# Patient Record
Sex: Female | Born: 1940 | Race: White | Hispanic: No | Smoking: Former smoker
Health system: Southern US, Community
[De-identification: ages and names within clinical notes are randomized; demographics above are authoritative.]

## PROBLEM LIST (undated history)

## (undated) DIAGNOSIS — M5030 Other cervical disc degeneration, unspecified cervical region: Secondary | ICD-10-CM

## (undated) DIAGNOSIS — G43909 Migraine, unspecified, not intractable, without status migrainosus: Secondary | ICD-10-CM

## (undated) DIAGNOSIS — E079 Disorder of thyroid, unspecified: Secondary | ICD-10-CM

## (undated) DIAGNOSIS — I4891 Unspecified atrial fibrillation: Secondary | ICD-10-CM

## (undated) DIAGNOSIS — F0280 Dementia in other diseases classified elsewhere without behavioral disturbance: Secondary | ICD-10-CM

## (undated) DIAGNOSIS — E7800 Pure hypercholesterolemia, unspecified: Secondary | ICD-10-CM

## (undated) DIAGNOSIS — I10 Essential (primary) hypertension: Secondary | ICD-10-CM

## (undated) HISTORY — PX: APPENDECTOMY: SHX54

## (undated) HISTORY — PX: ABDOMINAL HYSTERECTOMY: SHX81

## (undated) HISTORY — PX: THYROIDECTOMY: SHX17

## (undated) HISTORY — PX: TONSILLECTOMY: SUR1361

---

## 2019-02-25 ENCOUNTER — Emergency Department (HOSPITAL_COMMUNITY): Payer: Medicare Other

## 2019-02-25 ENCOUNTER — Encounter (HOSPITAL_COMMUNITY): Payer: Self-pay | Admitting: *Deleted

## 2019-02-25 ENCOUNTER — Emergency Department (HOSPITAL_COMMUNITY)
Admission: EM | Admit: 2019-02-25 | Discharge: 2019-02-25 | Disposition: A | Discharge status: Home / Self Care | Payer: Medicare Other | Attending: Emergency Medicine | Admitting: Emergency Medicine

## 2019-02-25 ENCOUNTER — Other Ambulatory Visit: Payer: Self-pay

## 2019-02-25 DIAGNOSIS — Y999 Unspecified external cause status: Secondary | ICD-10-CM | POA: Diagnosis not present

## 2019-02-25 DIAGNOSIS — I10 Essential (primary) hypertension: Secondary | ICD-10-CM | POA: Diagnosis not present

## 2019-02-25 DIAGNOSIS — G309 Alzheimer's disease, unspecified: Secondary | ICD-10-CM | POA: Diagnosis not present

## 2019-02-25 DIAGNOSIS — S40012A Contusion of left shoulder, initial encounter: Secondary | ICD-10-CM | POA: Diagnosis not present

## 2019-02-25 DIAGNOSIS — S8001XA Contusion of right knee, initial encounter: Secondary | ICD-10-CM | POA: Insufficient documentation

## 2019-02-25 DIAGNOSIS — W19XXXA Unspecified fall, initial encounter: Secondary | ICD-10-CM | POA: Diagnosis not present

## 2019-02-25 DIAGNOSIS — Y92129 Unspecified place in nursing home as the place of occurrence of the external cause: Secondary | ICD-10-CM | POA: Insufficient documentation

## 2019-02-25 DIAGNOSIS — S8002XA Contusion of left knee, initial encounter: Secondary | ICD-10-CM | POA: Diagnosis not present

## 2019-02-25 DIAGNOSIS — Y939 Activity, unspecified: Secondary | ICD-10-CM | POA: Insufficient documentation

## 2019-02-25 DIAGNOSIS — I4891 Unspecified atrial fibrillation: Secondary | ICD-10-CM | POA: Insufficient documentation

## 2019-02-25 DIAGNOSIS — S0083XA Contusion of other part of head, initial encounter: Secondary | ICD-10-CM | POA: Diagnosis not present

## 2019-02-25 DIAGNOSIS — S8000XA Contusion of unspecified knee, initial encounter: Secondary | ICD-10-CM

## 2019-02-25 DIAGNOSIS — S40011A Contusion of right shoulder, initial encounter: Secondary | ICD-10-CM | POA: Diagnosis not present

## 2019-02-25 DIAGNOSIS — E039 Hypothyroidism, unspecified: Secondary | ICD-10-CM | POA: Diagnosis not present

## 2019-02-25 DIAGNOSIS — S40019A Contusion of unspecified shoulder, initial encounter: Secondary | ICD-10-CM

## 2019-02-25 DIAGNOSIS — S60222A Contusion of left hand, initial encounter: Secondary | ICD-10-CM | POA: Diagnosis not present

## 2019-02-25 DIAGNOSIS — S0990XA Unspecified injury of head, initial encounter: Secondary | ICD-10-CM | POA: Diagnosis present

## 2019-02-25 HISTORY — DX: Other cervical disc degeneration, unspecified cervical region: M50.30

## 2019-02-25 HISTORY — DX: Dementia in other diseases classified elsewhere, unspecified severity, without behavioral disturbance, psychotic disturbance, mood disturbance, and anxiety: F02.80

## 2019-02-25 HISTORY — DX: Disorder of thyroid, unspecified: E07.9

## 2019-02-25 HISTORY — DX: Pure hypercholesterolemia, unspecified: E78.00

## 2019-02-25 HISTORY — DX: Unspecified atrial fibrillation: I48.91

## 2019-02-25 HISTORY — DX: Essential (primary) hypertension: I10

## 2019-02-25 HISTORY — DX: Migraine, unspecified, not intractable, without status migrainosus: G43.909

## 2019-02-25 LAB — BASIC METABOLIC PANEL
Anion gap: 13 (ref 5–15)
BUN: 29 mg/dL — ABNORMAL HIGH (ref 8–23)
CO2: 24 mmol/L (ref 22–32)
Calcium: 9.3 mg/dL (ref 8.9–10.3)
Chloride: 103 mmol/L (ref 98–111)
Creatinine, Ser: 0.84 mg/dL (ref 0.44–1.00)
GFR calc Af Amer: >60 mL/min (ref >60)
GFR calc non Af Amer: >60 mL/min (ref >60)
Glucose, Bld: 114 mg/dL — ABNORMAL HIGH (ref 70–99)
Potassium: 4.2 mmol/L (ref 3.5–5.1)
Sodium: 140 mmol/L (ref 135–145)

## 2019-02-25 LAB — CBC WITH DIFFERENTIAL/PLATELET
Abs Immature Granulocytes: 0.06 10*3/uL (ref 0.00–0.07)
Basophils Absolute: 0 10*3/uL (ref 0.0–0.1)
Basophils Relative: 0 %
Eosinophils Absolute: 0 10*3/uL (ref 0.0–0.5)
Eosinophils Relative: 0 %
HCT: 41.5 % (ref 36.0–46.0)
Hemoglobin: 13.4 g/dL (ref 12.0–15.0)
Immature Granulocytes: 1 %
Lymphocytes Relative: 7 %
Lymphs Abs: 0.8 10*3/uL (ref 0.7–4.0)
MCH: 30 pg (ref 26.0–34.0)
MCHC: 32.3 g/dL (ref 30.0–36.0)
MCV: 92.8 fL (ref 80.0–100.0)
Monocytes Absolute: 0.9 10*3/uL (ref 0.1–1.0)
Monocytes Relative: 8 %
Neutro Abs: 9.6 10*3/uL — ABNORMAL HIGH (ref 1.7–7.7)
Neutrophils Relative %: 84 %
Platelets: 255 10*3/uL (ref 150–400)
RBC: 4.47 MIL/uL (ref 3.87–5.11)
RDW: 14.2 % (ref 11.5–15.5)
WBC: 11.4 10*3/uL — ABNORMAL HIGH (ref 4.0–10.5)
nRBC: 0 % (ref 0.0–0.2)

## 2019-02-25 LAB — CK: Total CK: 163 U/L (ref 38–234)

## 2019-02-25 NOTE — ED Triage Notes (Addendum)
Pt arrives via [facility] from [facility]. Pt was last seen around 2200 last night. Staff found her in a different building on the floor around 0430. Hx of dementia (at baseline). She does have dirt on her pants, ? Outside at some point during the night. Unknown how many times she has fallen since last being seen. Bruising to bilateral eyebrow, abrasions, and hand pain (bilateral). C collar in place. 110/60, hr 65, cbg 150. Pt is on ELIQUIS.

## 2019-02-25 NOTE — ED Notes (Signed)
Daughter called due to pts rings were in bed in a cup. Daughter states she will come back to get them, gave her my personal number so I can run them outside when she's here.

## 2019-02-25 NOTE — ED Notes (Signed)
Removed two white stone rings from the left hand, placed in cup, labeled and taped to pt bed

## 2019-02-25 NOTE — Progress Notes (Signed)
Orthopedic Tech Progress Note Patient Details:  Alison King 09/09/1940 [id number]  Ortho Devices Type of Ortho Device: Velcro wrist forearm splint Ortho Device/Splint Location: LUE Ortho Device/Splint Interventions: Adjustment, Application, Ordered   Post Interventions Patient Tolerated: Well Instructions Provided: Care of device, Adjustment of device   Janit Pagan 02/25/2019, 10:13 AM

## 2019-02-25 NOTE — Discharge Instructions (Addendum)
Wear splint as needed for comfort. May apply ice for 20 minutes at a time, elevate as tolerated to help with pain and swelling. Tylenol as needed as directed.

## 2019-02-25 NOTE — ED Notes (Signed)
Patient's daughter in law at bedside verbalized understanding of dc instructions. pts vss, assisted into car by this RN and Joellen Jersey, Therapist, sports.

## 2019-02-25 NOTE — ED Provider Notes (Signed)
[facility] Provider Note   CSN: [id number] Arrival date & time: 02/25/19  0532     History   Chief Complaint Chief Complaint  Patient presents with   Fall    HPI Crista CurbGlenna Mcquain is a 78 y.o. female.     78 year old female with history of dementia, a-fib, on Eliquis brought in by EMS from nursing facility.  Patient was last seen at 10:00 last night, was found at 430 this morning inside a different building.  Patient is unsure what happened to her, is able to state that she fell and her head hurts, shoulders hurt, left hand hurts..  Patient has a history of dementia, is reported to be at baseline.     Past Medical History:  Diagnosis Date   Alzheimer's dementia [facility](HCC)    Atrial fibrillation (HCC)    DDD (degenerative disc disease), cervical    High cholesterol    Hypertension    Migraine    Thyroid disease    hypothyroidism    There are no active problems to display for this patient.   Past Surgical History:  Procedure Laterality Date   ABDOMINAL HYSTERECTOMY     APPENDECTOMY     THYROIDECTOMY     TONSILLECTOMY       OB History   No obstetric history on file.      Home Medications    Prior to Admission medications   Not on File    Family History No family history on file.  Social History Social History   Tobacco Use   Smoking status: Not on file  Substance Use Topics   Alcohol use: Not on file   Drug use: Not on file     Allergies   Atorvastatin calcium and Sulfa antibiotics   Review of Systems Review of Systems  Unable to perform ROS: Dementia  Musculoskeletal: Positive for arthralgias.  Neurological: Positive for headaches.     Physical Exam Updated Vital Signs BP 132/72 (BP Location: Right Arm)    Pulse 68    Temp 97.7 F (36.5 C) (Oral)    Resp 19    SpO2 95%   Physical Exam Vitals signs and nursing note reviewed.  Constitutional:      General: She is not in acute  distress.    Appearance: She is well-developed. She is not diaphoretic.  HENT:     Head: Normocephalic.      Mouth/Throat:     Mouth: Mucous membranes are moist.  Eyes:     Extraocular Movements: Extraocular movements intact.     Pupils: Pupils are equal, round, and reactive to light.  Neck:     Musculoskeletal: No muscular tenderness.  Pulmonary:     Effort: Pulmonary effort is normal.  Abdominal:     Palpations: Abdomen is soft.     Tenderness: There is no abdominal tenderness.  Musculoskeletal:        General: Swelling, tenderness and signs of injury present. No deformity.     Right shoulder: She exhibits tenderness.     Left shoulder: She exhibits tenderness.     Right knee: Tenderness found.     Left knee: Tenderness found.     Cervical back: She exhibits no bony tenderness.     Thoracic back: She exhibits no bony tenderness.     Lumbar back: She exhibits no tenderness.     Left hand: She exhibits tenderness and swelling.     Right lower leg: No edema.  Left lower leg: No edema.     Comments: Bruising to bilateral anterior knees, proximal humerus, left hand. No pain with rotation of the hips.  Skin:    General: Skin is warm and dry.     Findings: Bruising present. No erythema or rash.  Neurological:     Mental Status: She is alert and oriented to person, place, and time.  Psychiatric:        Behavior: Behavior normal.      ED Treatments / Results  Labs (all labs ordered are listed, but only abnormal results are displayed) Labs Reviewed  BASIC METABOLIC PANEL - Abnormal; Notable for the following components:      Result Value   Glucose, Bld 114 (*)    BUN 29 (*)    All other components within normal limits  CBC WITH DIFFERENTIAL/PLATELET - Abnormal; Notable for the following components:   WBC 11.4 (*)    Neutro Abs 9.6 (*)    All other components within normal limits  CK    EKG EKG Interpretation  Date/Time:  Wednesday February 25 2019 05:40:45  EST Ventricular Rate:  68 PR Interval:    QRS Duration: 131 QT Interval:  465 QTC Calculation: 495 R Axis:   11 Text Interpretation: Sinus or ectopic atrial rhythm Left bundle branch block No previous ECGs available Abnormal ekg Confirmed by Zadie Rhine ([id number]) on 02/25/2019 5:44:27 AM   Radiology Dg Chest 1 View  Result Date: 02/25/2019 CLINICAL DATA:  Fall at home. EXAM: CHEST  1 VIEW COMPARISON:  None. FINDINGS: The heart size and mediastinal contours are within normal limits. Both lungs are clear. No pneumothorax pleural effusion is noted. The visualized skeletal structures are unremarkable. IMPRESSION: No active disease. Electronically Signed   By: Lupita Raider M.D.   On: 02/25/2019 08:53   Dg Shoulder Right  Result Date: 02/25/2019 CLINICAL DATA:  Right shoulder pain after fall. EXAM: RIGHT SHOULDER - 2+ VIEW COMPARISON:  None. FINDINGS: There is no evidence of fracture or dislocation. There is no evidence of arthropathy or other focal bone abnormality. Soft tissues are unremarkable. IMPRESSION: Negative. Electronically Signed   By: Lupita Raider M.D.   On: 02/25/2019 08:47   Ct Head Wo Contrast  Result Date: 02/25/2019 CLINICAL DATA:  Posttraumatic headache after fall. EXAM: CT HEAD WITHOUT CONTRAST CT CERVICAL SPINE WITHOUT CONTRAST TECHNIQUE: Multidetector CT imaging of the head and cervical spine was performed following the standard protocol without intravenous contrast. Multiplanar CT image reconstructions of the cervical spine were also generated. COMPARISON:  None. FINDINGS: CT HEAD FINDINGS Brain: Mild chronic ischemic white matter disease is noted. No mass effect or midline shift is noted. Ventricular size is within normal limits. There is no evidence of mass lesion, hemorrhage or acute infarction. Vascular: No hyperdense vessel or unexpected calcification. Skull: Normal. Negative for fracture or focal lesion. Sinuses/Orbits: No acute finding. Other: Small left  frontal scalp hematoma is noted. CT CERVICAL SPINE FINDINGS Alignment: Minimal grade 1 retrolisthesis of C4-5 and C5-6 is noted secondary to degenerative disc disease at these levels. Skull base and vertebrae: No acute fracture. No primary bone lesion or focal pathologic process. Soft tissues and spinal canal: No prevertebral fluid or swelling. No visible canal hematoma. Disc levels: Moderate degenerative disc disease is noted at C4-5, C5-6 and C6-7. Upper chest: Negative. Other: None. IMPRESSION: Small left frontal scalp hematoma. Mild chronic ischemic white matter disease. No acute intracranial abnormality seen. Moderate multilevel degenerative disc disease. No acute abnormality  seen in the cervical spine. Electronically Signed   By: Marijo Conception M.D.   On: 02/25/2019 07:54   Ct Cervical Spine Wo Contrast  Result Date: 02/25/2019 CLINICAL DATA:  Posttraumatic headache after fall. EXAM: CT HEAD WITHOUT CONTRAST CT CERVICAL SPINE WITHOUT CONTRAST TECHNIQUE: Multidetector CT imaging of the head and cervical spine was performed following the standard protocol without intravenous contrast. Multiplanar CT image reconstructions of the cervical spine were also generated. COMPARISON:  None. FINDINGS: CT HEAD FINDINGS Brain: Mild chronic ischemic white matter disease is noted. No mass effect or midline shift is noted. Ventricular size is within normal limits. There is no evidence of mass lesion, hemorrhage or acute infarction. Vascular: No hyperdense vessel or unexpected calcification. Skull: Normal. Negative for fracture or focal lesion. Sinuses/Orbits: No acute finding. Other: Small left frontal scalp hematoma is noted. CT CERVICAL SPINE FINDINGS Alignment: Minimal grade 1 retrolisthesis of C4-5 and C5-6 is noted secondary to degenerative disc disease at these levels. Skull base and vertebrae: No acute fracture. No primary bone lesion or focal pathologic process. Soft tissues and spinal canal: No prevertebral  fluid or swelling. No visible canal hematoma. Disc levels: Moderate degenerative disc disease is noted at C4-5, C5-6 and C6-7. Upper chest: Negative. Other: None. IMPRESSION: Small left frontal scalp hematoma. Mild chronic ischemic white matter disease. No acute intracranial abnormality seen. Moderate multilevel degenerative disc disease. No acute abnormality seen in the cervical spine. Electronically Signed   By: Marijo Conception M.D.   On: 02/25/2019 07:54   Dg Shoulder Left  Result Date: 02/25/2019 CLINICAL DATA:  78 year old female with fall and left shoulder pain. EXAM: LEFT SHOULDER - 2+ VIEW COMPARISON:  None. FINDINGS: There is no evidence of fracture or dislocation. There is no evidence of arthropathy or other focal bone abnormality. Soft tissues are unremarkable. IMPRESSION: Negative. Electronically Signed   By: Anner Crete M.D.   On: 02/25/2019 08:47   Dg Knee Complete 4 Views Left  Result Date: 02/25/2019 CLINICAL DATA:  Left knee pain after fall at home. EXAM: LEFT KNEE - COMPLETE 4+ VIEW COMPARISON:  None. FINDINGS: No evidence of fracture, dislocation, or joint effusion. No evidence of arthropathy or other focal bone abnormality. Soft tissues are unremarkable. IMPRESSION: Negative. Electronically Signed   By: Marijo Conception M.D.   On: 02/25/2019 08:50   Dg Knee Complete 4 Views Right  Result Date: 02/25/2019 CLINICAL DATA:  Right knee pain after fall. EXAM: RIGHT KNEE - COMPLETE 4+ VIEW COMPARISON:  None. FINDINGS: No evidence of fracture, dislocation, or joint effusion. No evidence of arthropathy or other focal bone abnormality. Soft tissues are unremarkable. IMPRESSION: Negative. Electronically Signed   By: Marijo Conception M.D.   On: 02/25/2019 08:49   Dg Hand Complete Left  Result Date: 02/25/2019 CLINICAL DATA:  Left hand pain after fall. EXAM: LEFT HAND - COMPLETE 3+ VIEW COMPARISON:  None. FINDINGS: There is no evidence of fracture or dislocation. There is no evidence of  arthropathy or other focal bone abnormality. Soft tissues are unremarkable. IMPRESSION: Negative. Electronically Signed   By: Marijo Conception M.D.   On: 02/25/2019 08:51    Procedures Procedures (including critical care time)  Medications Ordered in ED Medications - No data to display   Initial Impression / Assessment and Plan / ED Course  I have reviewed the triage vital signs and the nursing notes.  Pertinent labs & imaging results that were available during my care of the patient were  reviewed by me and considered in my medical decision making (see chart for details).  Clinical Course as of Feb 25 1455  Wed Feb 25, 2019  1061 78 year old female brought in by EMS from nursing facility after fall today.  Patient found to have bruising to left and right forehead areas as well as bilateral shoulders, bilateral knees, left hand.  CT head and C-spine without significant findings, x-rays of both shoulders, knees, left hand unremarkable, negative for acute bony injury.  CBC without significant findings, awaiting CK, BMP and urinalysis.  Patient's daughter-in-law is at bedside, discussed results and updated on plan of care.   [LM]  0931 CK WNL, offered to check UA, patient's daughter in law declines, no history of frequent UTIs, has follow up later in the week, will check then if needed.   [LM]    Clinical Course User Index [LM] Jeannie Fend, PA-C      Final Clinical Impressions(s) / ED Diagnoses   Final diagnoses:  Fall, initial encounter  Contusion of face, initial encounter  Contusion of shoulder, unspecified laterality, initial encounter  Contusion of knee, unspecified laterality, initial encounter  Contusion of left hand, initial encounter    ED Discharge Orders    None       Jeannie Fend, PA-C 02/25/19 1456    Long, Arlyss Repress, MD 02/26/19 [phone]

## 2019-02-25 NOTE — ED Notes (Signed)
Pt is currently in radiology needs blood draw when back.

## 2019-06-12 ENCOUNTER — Emergency Department (HOSPITAL_COMMUNITY): Payer: Medicare Other

## 2019-06-12 ENCOUNTER — Inpatient Hospital Stay (HOSPITAL_COMMUNITY)
Admission: EM | Admit: 2019-06-12 | Discharge: 2019-06-17 | DRG: 689 | Disposition: A | Discharge status: Skilled Nursing Facility | Payer: Medicare Other | Source: Skilled Nursing Facility | Attending: Internal Medicine | Admitting: Internal Medicine

## 2019-06-12 ENCOUNTER — Encounter (HOSPITAL_COMMUNITY): Payer: Self-pay | Admitting: *Deleted

## 2019-06-12 ENCOUNTER — Other Ambulatory Visit: Payer: Self-pay

## 2019-06-12 DIAGNOSIS — Z1623 Resistance to quinolones and fluoroquinolones: Secondary | ICD-10-CM | POA: Diagnosis present

## 2019-06-12 DIAGNOSIS — G9341 Metabolic encephalopathy: Secondary | ICD-10-CM | POA: Diagnosis present

## 2019-06-12 DIAGNOSIS — E785 Hyperlipidemia, unspecified: Secondary | ICD-10-CM | POA: Diagnosis present

## 2019-06-12 DIAGNOSIS — Z79899 Other long term (current) drug therapy: Secondary | ICD-10-CM

## 2019-06-12 DIAGNOSIS — E890 Postprocedural hypothyroidism: Secondary | ICD-10-CM | POA: Diagnosis present

## 2019-06-12 DIAGNOSIS — N3000 Acute cystitis without hematuria: Principal | ICD-10-CM

## 2019-06-12 DIAGNOSIS — Z90710 Acquired absence of both cervix and uterus: Secondary | ICD-10-CM

## 2019-06-12 DIAGNOSIS — I480 Paroxysmal atrial fibrillation: Secondary | ICD-10-CM | POA: Diagnosis present

## 2019-06-12 DIAGNOSIS — R4182 Altered mental status, unspecified: Secondary | ICD-10-CM

## 2019-06-12 DIAGNOSIS — Z66 Do not resuscitate: Secondary | ICD-10-CM | POA: Diagnosis present

## 2019-06-12 DIAGNOSIS — B9620 Unspecified Escherichia coli [E. coli] as the cause of diseases classified elsewhere: Secondary | ICD-10-CM | POA: Diagnosis present

## 2019-06-12 DIAGNOSIS — Z79890 Hormone replacement therapy (postmenopausal): Secondary | ICD-10-CM

## 2019-06-12 DIAGNOSIS — Z20822 Contact with and (suspected) exposure to covid-19: Secondary | ICD-10-CM | POA: Diagnosis present

## 2019-06-12 DIAGNOSIS — R638 Other symptoms and signs concerning food and fluid intake: Secondary | ICD-10-CM

## 2019-06-12 DIAGNOSIS — F0280 Dementia in other diseases classified elsewhere without behavioral disturbance: Secondary | ICD-10-CM | POA: Diagnosis present

## 2019-06-12 DIAGNOSIS — N390 Urinary tract infection, site not specified: Secondary | ICD-10-CM

## 2019-06-12 DIAGNOSIS — Z87440 Personal history of urinary (tract) infections: Secondary | ICD-10-CM

## 2019-06-12 DIAGNOSIS — G309 Alzheimer's disease, unspecified: Secondary | ICD-10-CM | POA: Diagnosis present

## 2019-06-12 DIAGNOSIS — G8929 Other chronic pain: Secondary | ICD-10-CM | POA: Diagnosis present

## 2019-06-12 DIAGNOSIS — F05 Delirium due to known physiological condition: Secondary | ICD-10-CM | POA: Diagnosis not present

## 2019-06-12 DIAGNOSIS — R5383 Other fatigue: Secondary | ICD-10-CM

## 2019-06-12 DIAGNOSIS — Z888 Allergy status to other drugs, medicaments and biological substances status: Secondary | ICD-10-CM

## 2019-06-12 DIAGNOSIS — I10 Essential (primary) hypertension: Secondary | ICD-10-CM | POA: Diagnosis present

## 2019-06-12 DIAGNOSIS — K219 Gastro-esophageal reflux disease without esophagitis: Secondary | ICD-10-CM | POA: Diagnosis present

## 2019-06-12 DIAGNOSIS — W19XXXA Unspecified fall, initial encounter: Secondary | ICD-10-CM

## 2019-06-12 DIAGNOSIS — Z79891 Long term (current) use of opiate analgesic: Secondary | ICD-10-CM

## 2019-06-12 DIAGNOSIS — Z781 Physical restraint status: Secondary | ICD-10-CM

## 2019-06-12 DIAGNOSIS — R296 Repeated falls: Secondary | ICD-10-CM | POA: Diagnosis present

## 2019-06-12 DIAGNOSIS — Z882 Allergy status to sulfonamides status: Secondary | ICD-10-CM

## 2019-06-12 LAB — CBC WITH DIFFERENTIAL/PLATELET
Abs Immature Granulocytes: 0.03 10*3/uL (ref 0.00–0.07)
Basophils Absolute: 0 10*3/uL (ref 0.0–0.1)
Basophils Relative: 1 %
Eosinophils Absolute: 0.1 10*3/uL (ref 0.0–0.5)
Eosinophils Relative: 3 %
HCT: 38.7 % (ref 36.0–46.0)
Hemoglobin: 12.5 g/dL (ref 12.0–15.0)
Immature Granulocytes: 1 %
Lymphocytes Relative: 26 %
Lymphs Abs: 1.4 10*3/uL (ref 0.7–4.0)
MCH: 30.6 pg (ref 26.0–34.0)
MCHC: 32.3 g/dL (ref 30.0–36.0)
MCV: 94.9 fL (ref 80.0–100.0)
Monocytes Absolute: 0.8 10*3/uL (ref 0.1–1.0)
Monocytes Relative: 15 %
Neutro Abs: 2.9 10*3/uL (ref 1.7–7.7)
Neutrophils Relative %: 54 %
Platelets: 201 10*3/uL (ref 150–400)
RBC: 4.08 MIL/uL (ref 3.87–5.11)
RDW: 13.7 % (ref 11.5–15.5)
WBC: 5.2 10*3/uL (ref 4.0–10.5)
nRBC: 0 % (ref 0.0–0.2)

## 2019-06-12 LAB — URINALYSIS, ROUTINE W REFLEX MICROSCOPIC
Bilirubin Urine: NEGATIVE
Glucose, UA: NEGATIVE mg/dL
Hgb urine dipstick: NEGATIVE
Ketones, ur: NEGATIVE mg/dL
Nitrite: POSITIVE — AB
Protein, ur: NEGATIVE mg/dL
Specific Gravity, Urine: 1.009 (ref 1.005–1.030)
pH: 6 (ref 5.0–8.0)

## 2019-06-12 LAB — COMPREHENSIVE METABOLIC PANEL
ALT: 24 U/L (ref 0–44)
AST: 20 U/L (ref 15–41)
Albumin: 4 g/dL (ref 3.5–5.0)
Alkaline Phosphatase: 86 U/L (ref 38–126)
Anion gap: 8 (ref 5–15)
BUN: 20 mg/dL (ref 8–23)
CO2: 27 mmol/L (ref 22–32)
Calcium: 9.3 mg/dL (ref 8.9–10.3)
Chloride: 103 mmol/L (ref 98–111)
Creatinine, Ser: 0.73 mg/dL (ref 0.44–1.00)
GFR calc Af Amer: >60 mL/min (ref >60)
GFR calc non Af Amer: >60 mL/min (ref >60)
Glucose, Bld: 100 mg/dL — ABNORMAL HIGH (ref 70–99)
Potassium: 4.3 mmol/L (ref 3.5–5.1)
Sodium: 138 mmol/L (ref 135–145)
Total Bilirubin: 0.7 mg/dL (ref 0.3–1.2)
Total Protein: 7.1 g/dL (ref 6.5–8.1)

## 2019-06-12 LAB — LACTIC ACID, PLASMA: Lactic Acid, Venous: 1 mmol/L (ref 0.5–1.9)

## 2019-06-12 LAB — TSH: TSH: 0.309 u[IU]/mL — ABNORMAL LOW (ref 0.350–4.500)

## 2019-06-12 MED ORDER — SODIUM CHLORIDE 0.9 % IV BOLUS
1000.0000 mL | Freq: Once | INTRAVENOUS | Status: AC
  Administered 2019-06-12: 1000 mL via INTRAVENOUS

## 2019-06-12 MED ORDER — SODIUM CHLORIDE 0.9 % IV SOLN
1.0000 g | Freq: Once | INTRAVENOUS | Status: AC
  Administered 2019-06-12: 1 g via INTRAVENOUS
  Filled 2019-06-12: qty 10

## 2019-06-12 NOTE — ED Notes (Signed)
Pt to xray and ct

## 2019-06-12 NOTE — ED Triage Notes (Addendum)
Pt's husband called EMS because pt has been mildly more confused then baseline dementia and had an increase in general weakness and lethargy. Pt received her 2nd dose of the covid vaccine yesterday. Also EMS noted that the pt's urine was cloudy and odorous when they assisted the pt to the bathroom prior to transfer to the ER. ALso of note pt had recent changes made to her psych medications

## 2019-06-12 NOTE — ED Notes (Signed)
POA at bedside and MD made aware.

## 2019-06-12 NOTE — ED Provider Notes (Signed)
[facility]-EMERGENCY DEPT Provider Note   CSN: [id number] Arrival date & time: 06/12/19  2000     History Chief Complaint  Patient presents with  . Weakness    Alison King is a 79 y.o. female.  The history is provided by the patient and medical records. No language interpreter was used.  Altered Mental Status Presenting symptoms: confusion and unresponsiveness   Severity:  Moderate Most recent episode:  Today Episode history:  Single Timing:  Constant Progression:  Waxing and waning Chronicity:  New Associated symptoms: decreased appetite and hallucinations (at baseline)   Associated symptoms: no abdominal pain, no agitation, no depression, no fever, no headaches, no light-headedness, no nausea, no palpitations, no rash, no vomiting and no weakness        Past Medical History:  Diagnosis Date  . Alzheimer's dementia (HCC)   . Atrial fibrillation (HCC)   . DDD (degenerative disc disease), cervical   . High cholesterol   . Hypertension   . Migraine   . Thyroid disease    hypothyroidism    There are no problems to display for this patient.   Past Surgical History:  Procedure Laterality Date  . ABDOMINAL HYSTERECTOMY    . APPENDECTOMY    . THYROIDECTOMY    . TONSILLECTOMY       OB History   No obstetric history on file.     History reviewed. No pertinent family history.  Social History   Tobacco Use  . Smoking status: Not on file  Substance Use Topics  . Alcohol use: Not on file  . Drug use: Not on file    Home Medications Prior to Admission medications   Not on File    Allergies    Atorvastatin calcium and Sulfa antibiotics  Review of Systems   Review of Systems  Constitutional: Positive for decreased appetite and fatigue. Negative for chills and fever.  HENT: Negative for congestion.   Eyes: Negative for visual disturbance.  Respiratory: Negative for cough, chest tightness, shortness of breath and wheezing.     Cardiovascular: Negative for chest pain and palpitations.  Gastrointestinal: Negative for abdominal pain, constipation, diarrhea, nausea and vomiting.  Genitourinary: Positive for decreased urine volume. Negative for dysuria.  Musculoskeletal: Negative for back pain, neck pain and neck stiffness.  Skin: Negative for rash and wound.  Neurological: Negative for dizziness, weakness, light-headedness, numbness and headaches.  Psychiatric/Behavioral: Positive for confusion and hallucinations (at baseline). Negative for agitation.  All other systems reviewed and are negative.   Physical Exam Updated Vital Signs BP 133/89 (BP Location: Right Arm)   Pulse 67   Temp 98.2 F (36.8 C) (Oral)   Resp 19   SpO2 99% Comment: Simultaneous filing. User may not have seen previous data.  Physical Exam Vitals and nursing note reviewed.  Constitutional:      General: She is not in acute distress.    Appearance: She is well-developed. She is not ill-appearing, toxic-appearing or diaphoretic.  HENT:     Head: Normocephalic and atraumatic.     Nose: No congestion or rhinorrhea.     Mouth/Throat:     Mouth: Mucous membranes are moist.     Pharynx: No oropharyngeal exudate or posterior oropharyngeal erythema.  Eyes:     Extraocular Movements: Extraocular movements intact.     Conjunctiva/sclera: Conjunctivae normal.     Pupils: Pupils are equal, round, and reactive to light.  Cardiovascular:     Rate and Rhythm: Normal rate and regular rhythm.  Pulses: Normal pulses.     Heart sounds: No murmur.  Pulmonary:     Effort: Pulmonary effort is normal. No respiratory distress.     Breath sounds: Normal breath sounds. No wheezing, rhonchi or rales.  Chest:     Chest wall: No tenderness.  Abdominal:     General: Abdomen is flat.     Palpations: Abdomen is soft.     Tenderness: There is no abdominal tenderness. There is no right CVA tenderness, left CVA tenderness, guarding or rebound.   Musculoskeletal:        General: No tenderness.     Cervical back: Neck supple. No tenderness.  Skin:    General: Skin is warm and dry.     Capillary Refill: Capillary refill takes less than 2 seconds.     Coloration: Skin is not pale.     Findings: No erythema or rash.  Neurological:     General: No focal deficit present.     Mental Status: She is alert. She is confused.     Cranial Nerves: No dysarthria.     Motor: Tremor present. No atrophy or abnormal muscle tone.     ED Results / Procedures / Treatments   Labs (all labs ordered are listed, but only abnormal results are displayed) Labs Reviewed  COMPREHENSIVE METABOLIC PANEL - Abnormal; Notable for the following components:      Result Value   Glucose, Bld 100 (*)    All other components within normal limits  TSH - Abnormal; Notable for the following components:   TSH 0.309 (*)    All other components within normal limits  URINALYSIS, ROUTINE W REFLEX MICROSCOPIC - Abnormal; Notable for the following components:   APPearance HAZY (*)    Nitrite POSITIVE (*)    Leukocytes,Ua MODERATE (*)    Bacteria, UA MANY (*)    All other components within normal limits  URINE CULTURE  CULTURE, BLOOD (ROUTINE X 2)  CULTURE, BLOOD (ROUTINE X 2)  SARS CORONAVIRUS 2 (TAT 6-24 HRS)  LACTIC ACID, PLASMA  CBC WITH DIFFERENTIAL/PLATELET  CBC WITH DIFFERENTIAL/PLATELET    EKG EKG Interpretation  Date/Time:  Friday June 12 2019 22:30:02 EST Ventricular Rate:  77 PR Interval:    QRS Duration: 120 QT Interval:  414 QTC Calculation: 468 R Axis:   22 Text Interpretation: Atrial fibrillation Low voltage QRS Septal infarct , age undetermined Abnormal ECG When compared to prior,  no significant cahnges seen. NO STEMI Confirmed by Theda Belfast ([id number]) on 06/12/2019 11:08:56 PM   Radiology DG Chest 2 View  Result Date: 06/12/2019 CLINICAL DATA:  Altered mental status, fall yesterday EXAM: CHEST - 2 VIEW COMPARISON:  Radiograph  02/25/2019 FINDINGS: Low lung volumes with streaky basilar atelectatic changes. No focal consolidation or convincing features of edema. No pneumothorax or effusion. Mild prominence of the cardiac silhouette is likely related to portable technique on frontal radiograph. Cardiomediastinal contours are otherwise unremarkable. No acute osseous or soft tissue abnormality or injury is seen. IMPRESSION: Low volumes and atelectasis. No other acute cardiopulmonary or traumatic findings in the chest. Electronically Signed   By: Kreg Shropshire M.D.   On: 06/12/2019 22:06   CT Head Wo Contrast  Result Date: 06/12/2019 CLINICAL DATA:  Increasing confusion, fall yesterday EXAM: CT HEAD WITHOUT CONTRAST TECHNIQUE: Contiguous axial images were obtained from the base of the skull through the vertex without intravenous contrast. COMPARISON:  CT head 02/25/2019 FINDINGS: Brain: No evidence of acute infarction, hemorrhage, hydrocephalus, extra-axial collection or  mass lesion/mass effect. Symmetric prominence of the ventricles, cisterns and sulci compatible with parenchymal volume loss. Patchy areas of white matter hypoattenuation are most compatible with chronic microvascular angiopathy. Vascular: Atherosclerotic calcification of the carotid siphons and intradural vertebral arteries. No hyperdense vessel. Skull: No calvarial fracture or suspicious osseous lesion. No scalp swelling or hematoma. Sinuses/Orbits: Paranasal sinuses and mastoid air cells are predominantly clear. Included orbital structures are unremarkable. Other: Debris in the external auditory canals. IMPRESSION: 1. No acute intracranial findings. 2. Senescent changes with parenchymal volume loss and chronic microvascular angiopathy. 3. Debris in the external auditory canals, correlate for cerumen impaction. Electronically Signed   By: Kreg Shropshire M.D.   On: 06/12/2019 22:12    Procedures Procedures (including critical care time)  Medications Ordered in  ED Medications  sodium chloride 0.9 % bolus 1,000 mL (0 mLs Intravenous Stopped 06/13/19 0025)  cefTRIAXone (ROCEPHIN) 1 g in sodium chloride 0.9 % 100 mL IVPB (0 g Intravenous Stopped 06/13/19 0025)    ED Course  I have reviewed the triage vital signs and the nursing notes.  Pertinent labs & imaging results that were available during my care of the patient were reviewed by me and considered in my medical decision making (see chart for details).    MDM Rules/Calculators/A&P                      Alison King is a 79 y.o. female with a past medical history significant for Alzheimer's dementia, hypertension, thyroid disease, hypercholesterolemia, migraines, and atrial fibrillation who presents with altered mental status episode, decreased oral intake, falls, and urine changes.  According to daughter who was able to provide history, patient has had decreased oral intake for the last few days.  Patient is also more falls over the last few weeks including 1 yesterday out of bed.  The daughter reports that she was called because the patient was unable to be woken up at her facility this evening after taking a nap.  Reports he was unresponsive for several minutes before they were able to wake her up.  Daughter reports that she is nearly at her mental status baseline but still seems somewhat more sleepy than her normal self and slightly more confused.  They report that she has had a history of urinary tract infections and daughter thinks that could be going on.  EMS reportedly saw the patient urinate before leaving and it was very foul-smelling and appeared infected.  Therefore patient has had no recent fevers, chills, cough, chest pain, shortness of breath.  She is not having any headache after the fall and no other traumatic complaints.  On exam, lungs are clear and chest is nontender.  Abdomen is nontender.  Patient moving all extremities.  She is disoriented and family reports this is her baseline  orientation.  Her head was nontender neck was nontender.   We obtained a head CT showing no acute changes.  Chest x-ray showed no trauma or pneumonia.  Urine does show evidence of urinary tract infection I suspect this is contributing to her altered mental status and unsteadiness with more falls.  Patient was given dose of antibiotics and will be admitted for treatment of altered mental status and urinary tract infection likely contributing to recurrent falls.  Anticipate patient getting assessed by PT/OT to determine level of care needs after urine has successfully been treated.  Family is agreeable with this plan.  Medicine team called for admission.  Final Clinical Impression(s) / ED Diagnoses Final diagnoses:  Altered mental status, unspecified altered mental status type  Acute cystitis without hematuria  Fall, initial encounter  Decreased oral intake  Fatigue, unspecified type      Clinical Impression: 1. Altered mental status, unspecified altered mental status type   2. Acute cystitis without hematuria   3. Fall, initial encounter   4. Decreased oral intake   5. Fatigue, unspecified type     Disposition: Admit  This note was prepared with assistance of Dragon voice recognition software. Occasional wrong-word or sound-a-like substitutions may have occurred due to the inherent limitations of voice recognition software.      Courtland Reas, Gwenyth Allegra, MD 06/13/19 [phone]

## 2019-06-13 DIAGNOSIS — F0280 Dementia in other diseases classified elsewhere without behavioral disturbance: Secondary | ICD-10-CM | POA: Diagnosis present

## 2019-06-13 DIAGNOSIS — Z66 Do not resuscitate: Secondary | ICD-10-CM | POA: Diagnosis present

## 2019-06-13 DIAGNOSIS — Z781 Physical restraint status: Secondary | ICD-10-CM | POA: Diagnosis not present

## 2019-06-13 DIAGNOSIS — I480 Paroxysmal atrial fibrillation: Secondary | ICD-10-CM | POA: Diagnosis present

## 2019-06-13 DIAGNOSIS — Z20822 Contact with and (suspected) exposure to covid-19: Secondary | ICD-10-CM | POA: Diagnosis present

## 2019-06-13 DIAGNOSIS — G8929 Other chronic pain: Secondary | ICD-10-CM | POA: Diagnosis present

## 2019-06-13 DIAGNOSIS — N390 Urinary tract infection, site not specified: Secondary | ICD-10-CM | POA: Diagnosis not present

## 2019-06-13 DIAGNOSIS — B9620 Unspecified Escherichia coli [E. coli] as the cause of diseases classified elsewhere: Secondary | ICD-10-CM | POA: Diagnosis present

## 2019-06-13 DIAGNOSIS — Z90710 Acquired absence of both cervix and uterus: Secondary | ICD-10-CM | POA: Diagnosis not present

## 2019-06-13 DIAGNOSIS — I10 Essential (primary) hypertension: Secondary | ICD-10-CM | POA: Diagnosis present

## 2019-06-13 DIAGNOSIS — Z79890 Hormone replacement therapy (postmenopausal): Secondary | ICD-10-CM | POA: Diagnosis not present

## 2019-06-13 DIAGNOSIS — Z1623 Resistance to quinolones and fluoroquinolones: Secondary | ICD-10-CM | POA: Diagnosis present

## 2019-06-13 DIAGNOSIS — W19XXXA Unspecified fall, initial encounter: Secondary | ICD-10-CM | POA: Diagnosis present

## 2019-06-13 DIAGNOSIS — F05 Delirium due to known physiological condition: Secondary | ICD-10-CM | POA: Diagnosis not present

## 2019-06-13 DIAGNOSIS — R4182 Altered mental status, unspecified: Secondary | ICD-10-CM

## 2019-06-13 DIAGNOSIS — Z888 Allergy status to other drugs, medicaments and biological substances status: Secondary | ICD-10-CM | POA: Diagnosis not present

## 2019-06-13 DIAGNOSIS — Z79891 Long term (current) use of opiate analgesic: Secondary | ICD-10-CM | POA: Diagnosis not present

## 2019-06-13 DIAGNOSIS — E890 Postprocedural hypothyroidism: Secondary | ICD-10-CM | POA: Diagnosis present

## 2019-06-13 DIAGNOSIS — K219 Gastro-esophageal reflux disease without esophagitis: Secondary | ICD-10-CM | POA: Diagnosis present

## 2019-06-13 DIAGNOSIS — E785 Hyperlipidemia, unspecified: Secondary | ICD-10-CM | POA: Diagnosis present

## 2019-06-13 DIAGNOSIS — G9341 Metabolic encephalopathy: Secondary | ICD-10-CM | POA: Diagnosis present

## 2019-06-13 DIAGNOSIS — N3000 Acute cystitis without hematuria: Principal | ICD-10-CM

## 2019-06-13 DIAGNOSIS — Z882 Allergy status to sulfonamides status: Secondary | ICD-10-CM | POA: Diagnosis not present

## 2019-06-13 DIAGNOSIS — R5383 Other fatigue: Secondary | ICD-10-CM | POA: Diagnosis present

## 2019-06-13 DIAGNOSIS — R296 Repeated falls: Secondary | ICD-10-CM | POA: Diagnosis present

## 2019-06-13 DIAGNOSIS — Z87440 Personal history of urinary (tract) infections: Secondary | ICD-10-CM | POA: Diagnosis not present

## 2019-06-13 DIAGNOSIS — R638 Other symptoms and signs concerning food and fluid intake: Secondary | ICD-10-CM

## 2019-06-13 DIAGNOSIS — G309 Alzheimer's disease, unspecified: Secondary | ICD-10-CM | POA: Diagnosis present

## 2019-06-13 LAB — CBC
HCT: 41.7 % (ref 36.0–46.0)
Hemoglobin: 13 g/dL (ref 12.0–15.0)
MCH: 29.6 pg (ref 26.0–34.0)
MCHC: 31.2 g/dL (ref 30.0–36.0)
MCV: 95 fL (ref 80.0–100.0)
Platelets: 194 10*3/uL (ref 150–400)
RBC: 4.39 MIL/uL (ref 3.87–5.11)
RDW: 13.7 % (ref 11.5–15.5)
WBC: 7.7 10*3/uL (ref 4.0–10.5)
nRBC: 0 % (ref 0.0–0.2)

## 2019-06-13 LAB — COMPREHENSIVE METABOLIC PANEL
ALT: 20 U/L (ref 0–44)
AST: 21 U/L (ref 15–41)
Albumin: 3.4 g/dL — ABNORMAL LOW (ref 3.5–5.0)
Alkaline Phosphatase: 74 U/L (ref 38–126)
Anion gap: 8 (ref 5–15)
BUN: 17 mg/dL (ref 8–23)
CO2: 24 mmol/L (ref 22–32)
Calcium: 8.7 mg/dL — ABNORMAL LOW (ref 8.9–10.3)
Chloride: 108 mmol/L (ref 98–111)
Creatinine, Ser: 0.68 mg/dL (ref 0.44–1.00)
GFR calc Af Amer: >60 mL/min (ref >60)
GFR calc non Af Amer: >60 mL/min (ref >60)
Glucose, Bld: 91 mg/dL (ref 70–99)
Potassium: 4 mmol/L (ref 3.5–5.1)
Sodium: 140 mmol/L (ref 135–145)
Total Bilirubin: 0.6 mg/dL (ref 0.3–1.2)
Total Protein: 6.2 g/dL — ABNORMAL LOW (ref 6.5–8.1)

## 2019-06-13 LAB — SARS CORONAVIRUS 2 (TAT 6-24 HRS): SARS Coronavirus 2: NEGATIVE

## 2019-06-13 MED ORDER — SERTRALINE HCL 50 MG PO TABS
75.0000 mg | ORAL_TABLET | Freq: Every day | ORAL | Status: DC
  Administered 2019-06-13 – 2019-06-17 (×5): 75 mg via ORAL
  Filled 2019-06-13 (×5): qty 1

## 2019-06-13 MED ORDER — LEVOTHYROXINE SODIUM 88 MCG PO TABS
88.0000 ug | ORAL_TABLET | Freq: Every day | ORAL | Status: DC
  Administered 2019-06-13 – 2019-06-17 (×5): 88 ug via ORAL
  Filled 2019-06-13 (×6): qty 1

## 2019-06-13 MED ORDER — SODIUM CHLORIDE 0.9 % IV BOLUS
1000.0000 mL | Freq: Once | INTRAVENOUS | Status: AC
  Administered 2019-06-13: 1000 mL via INTRAVENOUS

## 2019-06-13 MED ORDER — TRAZODONE HCL 50 MG PO TABS
25.0000 mg | ORAL_TABLET | Freq: Every evening | ORAL | Status: DC | PRN
  Administered 2019-06-16: 25 mg via ORAL
  Filled 2019-06-13 (×2): qty 1

## 2019-06-13 MED ORDER — QUETIAPINE FUMARATE 25 MG PO TABS
12.5000 mg | ORAL_TABLET | Freq: Two times a day (BID) | ORAL | Status: DC
  Administered 2019-06-13: 12.5 mg via ORAL
  Filled 2019-06-13: qty 1

## 2019-06-13 MED ORDER — METOPROLOL TARTRATE 50 MG PO TABS
50.0000 mg | ORAL_TABLET | Freq: Two times a day (BID) | ORAL | Status: DC
  Administered 2019-06-13 – 2019-06-17 (×8): 50 mg via ORAL
  Filled 2019-06-13 (×9): qty 1

## 2019-06-13 MED ORDER — POLYETHYLENE GLYCOL 3350 17 G PO PACK: 17.0000 g | PACK | Freq: Every day | ORAL | Status: DC | PRN

## 2019-06-13 MED ORDER — LORAZEPAM 2 MG/ML IJ SOLN
1.0000 mg | Freq: Once | INTRAMUSCULAR | Status: AC
  Administered 2019-06-14: 1 mg via INTRAVENOUS

## 2019-06-13 MED ORDER — LISINOPRIL 20 MG PO TABS
20.0000 mg | ORAL_TABLET | Freq: Every day | ORAL | Status: DC
  Administered 2019-06-13 – 2019-06-17 (×5): 20 mg via ORAL
  Filled 2019-06-13 (×5): qty 1

## 2019-06-13 MED ORDER — LORAZEPAM 2 MG/ML IJ SOLN: 1.0000 mg | Freq: Once | INTRAMUSCULAR | Status: AC

## 2019-06-13 MED ORDER — SODIUM CHLORIDE 0.9 % IV SOLN
1.0000 g | INTRAVENOUS | Status: DC
  Administered 2019-06-13 – 2019-06-14 (×2): 1 g via INTRAVENOUS
  Filled 2019-06-13 (×2): qty 1

## 2019-06-13 MED ORDER — ACETAMINOPHEN 650 MG RE SUPP: 650.0000 mg | Freq: Four times a day (QID) | RECTAL | Status: DC | PRN

## 2019-06-13 MED ORDER — APIXABAN 5 MG PO TABS
5.0000 mg | ORAL_TABLET | Freq: Two times a day (BID) | ORAL | Status: DC
  Administered 2019-06-13 – 2019-06-14 (×3): 5 mg via ORAL
  Filled 2019-06-13 (×4): qty 1

## 2019-06-13 MED ORDER — PANTOPRAZOLE SODIUM 40 MG PO TBEC
80.0000 mg | DELAYED_RELEASE_TABLET | Freq: Every day | ORAL | Status: DC
  Administered 2019-06-13 – 2019-06-17 (×5): 80 mg via ORAL
  Filled 2019-06-13 (×5): qty 2

## 2019-06-13 MED ORDER — QUETIAPINE FUMARATE 25 MG PO TABS
25.0000 mg | ORAL_TABLET | Freq: Every day | ORAL | Status: DC
  Administered 2019-06-14 – 2019-06-17 (×4): 25 mg via ORAL
  Filled 2019-06-13 (×4): qty 1

## 2019-06-13 MED ORDER — QUETIAPINE FUMARATE 50 MG PO TABS
50.0000 mg | ORAL_TABLET | Freq: Every day | ORAL | Status: DC
  Administered 2019-06-13 – 2019-06-16 (×3): 50 mg via ORAL
  Filled 2019-06-13 (×4): qty 1

## 2019-06-13 MED ORDER — ACETAMINOPHEN 325 MG PO TABS
650.0000 mg | ORAL_TABLET | Freq: Four times a day (QID) | ORAL | Status: DC | PRN
  Administered 2019-06-14: 650 mg via ORAL
  Filled 2019-06-13 (×2): qty 2

## 2019-06-13 MED ORDER — LORAZEPAM 2 MG/ML IJ SOLN
INTRAMUSCULAR | Status: AC
  Administered 2019-06-13: 1 mg via INTRAVENOUS
  Filled 2019-06-13: qty 1

## 2019-06-13 NOTE — Progress Notes (Signed)
No charge note  Patient admitted overnight, H&P reviewed and agree with the assessment and plan  79 year old female with history of dementia, chronic back pain, paroxysmal A. fib, hypertension, hyperlipidemia, who came in from independent living with increased confusion.  Over the last several days she is also been having poor p.o. intake as well as increased falls.  It was noted that she had foul-smelling urine at her facility.  Initial work-up in the ED was pertinent for urinary tract infection, she was given ceftriaxone and admitted to the hospital.   I have discussed with patient's daughter in law as well as patient's son.  Patient has had a degree of decline over the last several months, and they are in the process of moving her from her assisted living to memory care facility.  Patient has been agitated throughout the night, and her medications have recently been changed and her Seroquel has been increased from 75 mg total daily to 50 mg twice daily.  They felt like she has been a little bit more sleepy since.  Couple of days ago she underwent a second Covid shot.  Over the last 3 to 4 days, she has been having very poor p.o. intake, not drinking, and had an episode when she fell out of bed.  She has been more sleepy and her husband was unable to wake her up, and that is when she was brought to the hospital  Acute encephalopathy on underlying dementia -Worsened likely in the setting of urinary tract infection -Has received antibiotics last night however she remains more confused than her baseline.  I discussed with her son at bedside, and normally patient is able to recognize him, but today she has no idea who he is.  She also has been having rare hallucinations in the past, but now she appears to hallucinate significantly more and currently interacting with her hallucinations by having conversations with people who are not there -Her mental status is not back to baseline as described above, she  continues to require inpatient observation and evaluation  Urinary tract infection -Patient started on ceftriaxone, continue, urine cultures are pending  Recurrent falls -PT to evaluate  Afib  -Continue metoprolol, she is anticoagulated with Eliquis  HTN  -Continue lisinopril, metoprolol  GERD  -cont PPI  Hypothyroid -Continue Synthroid, TSH borderline at 0.309, continue same dose   Scheduled Meds: . apixaban  5 mg Oral BID  . levothyroxine  88 mcg Oral QAC breakfast  . lisinopril  20 mg Oral Daily  . metoprolol tartrate  50 mg Oral BID  . pantoprazole  80 mg Oral Daily  . [START ON 06/14/2019] QUEtiapine  25 mg Oral Daily   And  . QUEtiapine  50 mg Oral QHS  . sertraline  75 mg Oral Daily   Continuous Infusions: . cefTRIAXone (ROCEPHIN)  IV     PRN Meds:.acetaminophen **OR** acetaminophen, polyethylene glycol, traZODone  Drystan Reader M. Elvera Lennox, MD, PhD [facility]  Between 7 am - 7 pm I am available, please contact me via Amion or Securechat Between 7 pm - 7 am I am not available, please contact night coverage MD/APP via Amion

## 2019-06-13 NOTE — H&P (Signed)
[facility] History and Physical  Varonica Siharath ZOX:[id number] DOB: November 19, 1940 DOA: 06/12/2019  Referring physician: Tegeler PCP: April Manson, NP   Chief Complaint: AMS  HPI: Alison King is a 79 y.o. female with hx of dementia, chronic back pain, pAFib, HTN, HLD, who presents from care facility after episode of AMS.  Majority of history via ED physician as daughter Eustolia Drennen [phone]) was called but did not answer phone at time of admission.   In summary per Dr. Peggye Pitt hx patient had brief episode of AMS in which staff at her care facility were unable to wake her but she eventually returned to something like her baseline. In addition has been having other issues over the past week, including poor PO intake and falls. Patient has history of UTI, therefore was brought to ED for evaluation. ED eval notable for unremarkable CBC/CMP, unremarkable CT head, UA notable for +LE/+nitrite. Patient was given 1g Ceftriaxone and 1L NS bolus and admitted for treatment of presumed UTI and PT eval, urine and blood cultures pending.   On my interview patient denies any pain anywhere but unable to engage in questioning due to frequent nonsensical responses.   Review of Systems:  Unable to complete ROS due to condition of patient: dementia   Past Medical History:  Diagnosis Date  . Alzheimer's dementia (HCC)   . Atrial fibrillation (HCC)   . DDD (degenerative disc disease), cervical   . High cholesterol   . Hypertension   . Migraine   . Thyroid disease    hypothyroidism   Past Surgical History:  Procedure Laterality Date  . ABDOMINAL HYSTERECTOMY    . APPENDECTOMY    . THYROIDECTOMY    . TONSILLECTOMY     Social History:  has no history on file for tobacco, alcohol, and drug.  Allergies  Allergen Reactions  . Atorvastatin Calcium     Forgetful, foggy, leg cramps, off balance  . Sulfa Antibiotics Nausea Only    History reviewed. No pertinent family history.    Prior to Admission medications   Not on File   Physical Exam: Vitals:   06/12/19 2015 06/12/19 2019 06/12/19 2305  BP: 133/89  (!) 150/99  Pulse: 67  69  Resp: 19  19  Temp: 98.2 F (36.8 C) 98.2 F (36.8 C)   TempSrc: Oral Oral   SpO2: 99%  97%    Wt Readings from Last 3 Encounters:  No data found for Wt    General:  Appears calm and comfortable Eyes: PERRL, normal lids, irises & conjunctiva ENT: grossly normal hearing, lips & tongue, dry appearing mucosa Neck: no LAD, masses or thyromegaly Cardiovascular: RRR, no LE edema. Respiratory: Comfortable on room air. Normal respiratory effort. Abdomen: soft, ntnd Skin: no rash or induration seen on limited exam Musculoskeletal: grossly normal tone BUE/BLE Psychiatric: grossly normal mood and affect, speech fluent but nonsensical Neurologic: grossly non-focal.          Labs on Admission:  Basic Metabolic Panel: Recent Labs  Lab 06/12/19 2129  NA 138  K 4.3  CL 103  CO2 27  GLUCOSE 100*  BUN 20  CREATININE 0.73  CALCIUM 9.3   Liver Function Tests: Recent Labs  Lab 06/12/19 2129  AST 20  ALT 24  ALKPHOS 86  BILITOT 0.7  PROT 7.1  ALBUMIN 4.0   No results for input(s): LIPASE, AMYLASE in the last 168 hours. No results for input(s): AMMONIA in the last 168 hours. CBC: Recent Labs  Lab 06/12/19  2300  WBC 5.2  NEUTROABS 2.9  HGB 12.5  HCT 38.7  MCV 94.9  PLT 201   Cardiac Enzymes: No results for input(s): CKTOTAL, CKMB, CKMBINDEX, TROPONINI in the last 168 hours.  BNP (last 3 results) No results for input(s): BNP in the last 8760 hours.  ProBNP (last 3 results) No results for input(s): PROBNP in the last 8760 hours.  CBG: No results for input(s): GLUCAP in the last 168 hours.  Radiological Exams on Admission: DG Chest 2 View  Result Date: 06/12/2019 CLINICAL DATA:  Altered mental status, fall yesterday EXAM: CHEST - 2 VIEW COMPARISON:  Radiograph 02/25/2019 FINDINGS: Low lung volumes with  streaky basilar atelectatic changes. No focal consolidation or convincing features of edema. No pneumothorax or effusion. Mild prominence of the cardiac silhouette is likely related to portable technique on frontal radiograph. Cardiomediastinal contours are otherwise unremarkable. No acute osseous or soft tissue abnormality or injury is seen. IMPRESSION: Low volumes and atelectasis. No other acute cardiopulmonary or traumatic findings in the chest. Electronically Signed   By: Lovena Le M.D.   On: 06/12/2019 22:06   CT Head Wo Contrast  Result Date: 06/12/2019 CLINICAL DATA:  Increasing confusion, fall yesterday EXAM: CT HEAD WITHOUT CONTRAST TECHNIQUE: Contiguous axial images were obtained from the base of the skull through the vertex without intravenous contrast. COMPARISON:  CT head 02/25/2019 FINDINGS: Brain: No evidence of acute infarction, hemorrhage, hydrocephalus, extra-axial collection or mass lesion/mass effect. Symmetric prominence of the ventricles, cisterns and sulci compatible with parenchymal volume loss. Patchy areas of white matter hypoattenuation are most compatible with chronic microvascular angiopathy. Vascular: Atherosclerotic calcification of the carotid siphons and intradural vertebral arteries. No hyperdense vessel. Skull: No calvarial fracture or suspicious osseous lesion. No scalp swelling or hematoma. Sinuses/Orbits: Paranasal sinuses and mastoid air cells are predominantly clear. Included orbital structures are unremarkable. Other: Debris in the external auditory canals. IMPRESSION: 1. No acute intracranial findings. 2. Senescent changes with parenchymal volume loss and chronic microvascular angiopathy. 3. Debris in the external auditory canals, correlate for cerumen impaction. Electronically Signed   By: Lovena Le M.D.   On: 06/12/2019 22:12    EKG: Independently reviewed. Interventricular conduction block, Afib, T wave flattening V6 and aVL  Assessment/Plan Active  Problems:   Acute lower UTI   AMS (altered mental status)  #AMS #UTI Patient presenting with acute on chronic AMS and UA suspicious for UTI, will treat empirically while awaiting UCx results. Review of Care Everywhere whos multiple +UCx, primarily E.coli, all sensitive to Ceftriaxone. Low suspicion for acute intracranial event given normal CT head, grossly nonfocal neuro exam. CXR unremarkable. Appears to be prescribed Ativan at care facility, may have also contributed, held on admission. Electrolytes unremarkable. No other remarkable findings on exam.  - Ceftriaxone 1g q24h - total 2L IVF ordered - follow up UCx results - hold all meds associated with delirium in elderly  #Falls Increased frequency per daughter, possibly in setting of infection. - PT consult  #Known Medical Problems Afib - cont eliquis HTN - cont lisinopril, metoprolo GERD - cont PPI Dementia - cont seroquel, zoloft Hypothyroid - cont synthroid  Code Status: Full code presumed, confirm with daughter/facility in AM DVT Prophylaxis: lovenox Family Communication: unable to reach daughter Cristi Gwynn ([phone]) Disposition Plan: obs   Time spent: 50 min  Clarnce Flock MD/MPH [facility]

## 2019-06-13 NOTE — Evaluation (Signed)
Physical Therapy Evaluation Patient Details Name: Alison King MRN: [id number] DOB: [birth year] Today's Date: 06/13/2019   History of Present Illness  Pt admitted from [facility] community 2* increased confusion.  Pt with hx of s-fib, cervical DDD and dementia but per son, pt ambulates IND with RW and lives in cottage with spouse.  Clinical Impression  Pt admitted as above and presenting with functional mobility limitations 2* balance deficits, and AMS including limited safety awareness.  .  Prior to this decline, pt was IND mobilizing with RW and pt's son stated hopes pt can progress to return to IND living cottage with spouse.    Follow Up Recommendations Home health PT    Equipment Recommendations  None recommended by PT    Recommendations for Other Services OT consult     Precautions / Restrictions Precautions Precautions: Fall Restrictions Weight Bearing Restrictions: No      Mobility  Bed Mobility Overal bed mobility: Needs Assistance Bed Mobility: Supine to Sit     Supine to sit: Min assist     General bed mobility comments: use of rail and +++ cueing to gain pt participation  Transfers Overall transfer level: Needs assistance Equipment used: 2 person hand held assist Transfers: Sit to/from Stand Sit to Stand: Min assist;Mod assist;+2 physical assistance;+2 safety/equipment         General transfer comment: increased multimodal cues to direct pt  Ambulation/Gait Ambulation/Gait assistance: Min assist;+2 physical assistance Gait Distance (Feet): 200 Feet Assistive device: 2 person hand held assist Gait Pattern/deviations: Decreased step length - right;Decreased step length - left;Step-through pattern;Shuffle;Staggering left;Staggering right;Trunk flexed;Narrow base of support;Scissoring Gait velocity: decr   General Gait Details: increased multimodal cues to gain pt participation and follow through.  Pt very unsteady all directions and  with no insight into deficits  Stairs            Wheelchair Mobility    Modified Rankin (Stroke Patients Only)       Balance Overall balance assessment: Needs assistance Sitting-balance support: Bilateral upper extremity supported Sitting balance-Leahy Scale: Fair     Standing balance support: Bilateral upper extremity supported Standing balance-Leahy Scale: Poor                               Pertinent Vitals/Pain Pain Assessment: No/denies pain    Home Living Family/patient expects to be discharged to:: Other (Comment)                 Additional Comments: IND living    Prior Function Level of Independence: Independent with assistive device(s)         Comments: used RW per son     Hand Dominance        Extremity/Trunk Assessment   Upper Extremity Assessment Upper Extremity Assessment: Difficult to assess due to impaired cognition    Lower Extremity Assessment Lower Extremity Assessment: Difficult to assess due to impaired cognition       Communication   Communication: No difficulties  Cognition Arousal/Alertness: Awake/alert Behavior During Therapy: Impulsive Overall Cognitive Status: Impaired/Different from baseline Area of Impairment: Following commands;Safety/judgement;Awareness;Problem solving                       Following Commands: Follows one step commands inconsistently Safety/Judgement: Decreased awareness of safety;Decreased awareness of deficits   Problem Solving: Requires verbal cues;Requires tactile cues        General Comments  Exercises     Assessment/Plan    PT Assessment Patient needs continued PT services  PT Problem List Decreased strength;Decreased activity tolerance;Decreased balance;Decreased mobility;Decreased cognition;Decreased knowledge of use of DME;Decreased safety awareness       PT Treatment Interventions DME instruction;Gait training;Stair training;Functional mobility  training;Therapeutic activities;Therapeutic exercise;Patient/family education    PT Goals (Current goals can be found in the Care Plan section)  Acute Rehab PT Goals Patient Stated Goal: Pt unable to formulate goals PT Goal Formulation: Patient unable to participate in goal setting Time For Goal Achievement: 06/27/19 Potential to Achieve Goals: Fair    Frequency Min 3X/week   Barriers to discharge        Co-evaluation               AM-PAC PT "6 Clicks" Mobility  Outcome Measure Help needed turning from your back to your side while in a flat bed without using bedrails?: A Little Help needed moving from lying on your back to sitting on the side of a flat bed without using bedrails?: A Little Help needed moving to and from a bed to a chair (including a wheelchair)?: A Lot Help needed standing up from a chair using your arms (e.g., wheelchair or bedside chair)?: A Lot Help needed to walk in hospital room?: A Lot Help needed climbing 3-5 steps with a railing? : A Lot 6 Click Score: 14    End of Session Equipment Utilized During Treatment: Gait belt Activity Tolerance: Patient tolerated treatment well Patient left: in chair;with call bell/phone within reach;with chair alarm set;with nursing/sitter in room Nurse Communication: Mobility status PT Visit Diagnosis: Unsteadiness on feet (R26.81);Difficulty in walking, not elsewhere classified (R26.2)    Time: 5701-7793 PT Time Calculation (min) (ACUTE ONLY): 20 min   Charges:   PT Evaluation $PT Eval Moderate Complexity: 1 Mod          [place] Pager [phone] Office [phone]   Zharia Conrow 06/13/2019, 2:31 PM

## 2019-06-14 ENCOUNTER — Encounter (HOSPITAL_COMMUNITY): Payer: Self-pay | Admitting: Family Medicine

## 2019-06-14 DIAGNOSIS — N390 Urinary tract infection, site not specified: Secondary | ICD-10-CM

## 2019-06-14 MED ORDER — LORAZEPAM 2 MG/ML IJ SOLN
0.5000 mg | Freq: Once | INTRAMUSCULAR | Status: AC
  Administered 2019-06-15: 0.5 mg via INTRAMUSCULAR
  Filled 2019-06-14: qty 1

## 2019-06-14 MED ORDER — LORAZEPAM 2 MG/ML IJ SOLN: 0.5000 mg | Freq: Once | INTRAMUSCULAR | Status: DC

## 2019-06-14 MED ORDER — LORAZEPAM 2 MG/ML IJ SOLN
INTRAMUSCULAR | Status: AC
  Filled 2019-06-14: qty 1

## 2019-06-14 NOTE — Progress Notes (Addendum)
PROGRESS NOTE  Alison King YCX:[id number] DOB: [birth year] DOA: 06/12/2019 PCP: April Manson, NP   LOS: 1 day   Brief Narrative / Interim history: 79 year old female with history of dementia, chronic back pain, paroxysmal A. fib, hypertension, hyperlipidemia, who came in from independent living with increased confusion.  Over the last several days she is also been having poor p.o. intake as well as increased falls.  It was noted that she had foul-smelling urine at her facility.  Initial work-up in the ED was pertinent for urinary tract infection, she was given ceftriaxone and admitted to the hospital.   Subjective / 24h Interval events: Remains confused, has mittens on this morning  Assessment & Plan: Principal Problem Acute metabolic encephalopathy on underlying dementia -Worsened likely in the setting of urinary tract infection -Remains quite confused far from baseline, requiring restraints/sitter/mittens due to in-hospital delirium -Supportive treatment, frequent reorientation  Active Problems Urinary tract infection -Continue ceftriaxone, urine cultures speciated E. coli, sensitivities pending  Recurrent falls -PT recommending home health  Afib  -Continue metoprolol, she is anticoagulated with Eliquis  HTN  -Continue lisinopril, metoprolol  GERD  -cont PPI  Hypothyroid -Continue Synthroid, TSH borderline at 0.309, continue same dose  Scheduled Meds: . apixaban  5 mg Oral BID  . levothyroxine  88 mcg Oral QAC breakfast  . lisinopril  20 mg Oral Daily  . metoprolol tartrate  50 mg Oral BID  . pantoprazole  80 mg Oral Daily  . QUEtiapine  25 mg Oral Daily   And  . QUEtiapine  50 mg Oral QHS  . sertraline  75 mg Oral Daily   Continuous Infusions: . cefTRIAXone (ROCEPHIN)  IV 1 g (06/13/19 1824)   PRN Meds:.acetaminophen **OR** acetaminophen, polyethylene glycol, traZODone  DVT prophylaxis: On Eliquis Code Status: Full code Family Communication: d/w  son and daughter in law at bedside  Patient admitted from: ALF Anticipated d/c place: TBD Barriers to d/c: persistent metabolic encephalopathy  Consultants:  None   Procedures:  None   Microbiology  E coli in urine   Antimicrobials: Ceftriaxone    Objective: Vitals:   06/13/19 0700 06/13/19 1410 06/13/19 2140 06/14/19 0547  BP:  98/68 (!) 126/48 (!) 109/55  Pulse:  80 85 68  Resp:  20 16 15   Temp:  97.7 F (36.5 C) 98 F (36.7 C) 98.6 F (37 C)  TempSrc:  Oral    SpO2:  96% 95% 93%  Weight: 72.5 kg       Intake/Output Summary (Last 24 hours) at 06/14/2019 1316 Last data filed at 06/13/2019 1344 Gross per 24 hour  Intake 240 ml  Output --  Net 240 ml   Filed Weights   06/13/19 0700  Weight: 72.5 kg    Examination:  Constitutional: NAD Eyes: no scleral icterus ENMT: Mucous membranes are moist.  Neck: normal, supple Respiratory: clear to auscultation bilaterally, no wheezing, no crackles. Normal respiratory effort.  Cardiovascular: Regular rate and rhythm, no murmurs / rubs / gallops. No LE edema.  Abdomen: non distended, no tenderness.  Musculoskeletal: no clubbing / cyanosis.  Skin: no rashes Neurologic: non focal, doesn't follow commands consistently  Data Reviewed: I have independently reviewed following labs and imaging studies   CBC: Recent Labs  Lab 06/12/19 2300 06/13/19 0652  WBC 5.2 7.7  NEUTROABS 2.9  --   HGB 12.5 13.0  HCT 38.7 41.7  MCV 94.9 95.0  PLT 201 194   Basic Metabolic Panel: Recent Labs  Lab 06/12/19 2129 06/13/19  2355  NA 138 140  K 4.3 4.0  CL 103 108  CO2 27 24  GLUCOSE 100* 91  BUN 20 17  CREATININE 0.73 0.68  CALCIUM 9.3 8.7*   Liver Function Tests: Recent Labs  Lab 06/12/19 2129 06/13/19 0652  AST 20 21  ALT 24 20  ALKPHOS 86 74  BILITOT 0.7 0.6  PROT 7.1 6.2*  ALBUMIN 4.0 3.4*   Coagulation Profile: No results for input(s): INR, PROTIME in the last 168 hours. HbA1C: No results for input(s):  HGBA1C in the last 72 hours. CBG: No results for input(s): GLUCAP in the last 168 hours.  Recent Results (from the past 240 hour(s))  Urine culture     Status: Abnormal (Preliminary result)   Collection Time: 06/12/19  9:29 PM   Specimen: Urine, Random  Result Value Ref Range Status   Specimen Description   Final    URINE, RANDOM Performed at [facility], [address] [place], [place] [address]    Special Requests   Final    NONE Performed at [facility], [address], [place], [place] [address]    Culture >=100,000 COLONIES/mL ESCHERICHIA COLI (A)  Final   Report Status PENDING  Incomplete  Blood culture (routine x 2)     Status: None (Preliminary result)   Collection Time: 06/12/19  9:30 PM   Specimen: BLOOD  Result Value Ref Range Status   Specimen Description   Final    BLOOD RIGHT ANTECUBITAL Performed at [facility], [address] [place], [place] [address]    Special Requests   Final    BOTTLES DRAWN AEROBIC AND ANAEROBIC Blood Culture results may not be optimal due to an inadequate volume of blood received in culture bottles Performed at [facility], [address] [place], [place] [address]    Culture   Final    NO GROWTH 1 DAY Performed at [facility], [address], [place], [place] [address]    Report Status PENDING  Incomplete  Blood culture (routine x 2)     Status: None (Preliminary result)   Collection Time: 06/12/19  9:40 PM   Specimen: BLOOD RIGHT HAND  Result Value Ref Range Status   Specimen Description   Final    BLOOD RIGHT HAND Performed at [facility], [address] [place], [place] [address]    Special Requests   Final    BOTTLES DRAWN AEROBIC ONLY Blood Culture results may not be optimal due to an inadequate volume of blood received in culture bottles Performed at [facility], [address], [place],  [place] [address]    Culture   Final    NO GROWTH 1 DAY Performed at [facility], [address], [place], [place] [address]    Report Status PENDING  Incomplete  SARS CORONAVIRUS 2 (TAT 6-24 HRS) Nasopharyngeal Nasopharyngeal Swab     Status: None   Collection Time: 06/12/19 11:12 PM   Specimen: Nasopharyngeal Swab  Result Value Ref Range Status   SARS Coronavirus 2 NEGATIVE NEGATIVE Final    Comment: (NOTE) SARS-CoV-2 target nucleic acids are NOT DETECTED. The SARS-CoV-2 RNA is generally detectable in upper and lower respiratory specimens during the acute phase of infection. Negative results do not preclude SARS-CoV-2 infection, do not rule out co-infections with other pathogens, and should not be used as the sole basis for treatment or other patient management decisions. Negative  results must be combined with clinical observations, patient history, and epidemiological information. The expected result is Negative. Fact Sheet for Patients: SugarRoll.be Fact Sheet for Healthcare Providers: https://www.woods-mathews.com/ This test is not yet approved or cleared by the [place] FDA and  has been authorized for detection and/or diagnosis of SARS-CoV-2 by FDA under an Emergency Use Authorization (EUA). This EUA will remain  in effect (meaning this test can be used) for the duration of the COVID-19 declaration under Section 56 4(b)(1) of the Act, 21 U.S.C. section 360bbb-3(b)(1), unless the authorization is terminated or revoked sooner. Performed at [facility], [place] [address] [place], [place] [address]      Radiology Studies: No results found.  Marzetta Board, MD, PhD [facility]  Time spent: 35 minutes, more than 50% at bedside and in discussions with the patient, the patient's daughter in law at bedside, as well as the patient's son over the phone  Between 7 am - 7 pm I am available, please contact me via Amion or  Securechat  Between 7 pm - 7 am I am not available, please contact night coverage MD/APP via Amion

## 2019-06-14 NOTE — Progress Notes (Signed)
Physical Therapy Treatment Patient Details Name: Alison King MRN: [id number] DOB: 10/01/1940 Today's Date: 06/14/2019    History of Present Illness Pt admitted from [facility] community 2* increased confusion.  Pt with hx of s-fib, cervical DDD and dementia but per son, pt ambulates IND with RW and lives in cottage with spouse.    PT Comments    Pt expressing anxiety about having to go outside and continues to require ++encouragement to gain participation.   Pt's daughter-in-law present to clarify pt is unsteady at home and uses RW intermittently and that pt's spouse is also unsteady on feet and in no condition to provide assistance.  Pt currently requiring significant assist for safe performance of all mobility tasks and would benefit from follow up rehab at SNF level to maximize IND and safety.  Follow Up Recommendations  SNF     Equipment Recommendations  None recommended by PT    Recommendations for Other Services       Precautions / Restrictions Precautions Precautions: Fall Restrictions Weight Bearing Restrictions: No    Mobility  Bed Mobility Overal bed mobility: Needs Assistance Bed Mobility: Supine to Sit     Supine to sit: Min assist;Mod assist;+2 for physical assistance;+2 for safety/equipment     General bed mobility comments: use of rail and +++ cueing to gain pt participation  Transfers Overall transfer level: Needs assistance Equipment used: Rolling walker (2 wheeled) Transfers: Sit to/from Stand Sit to Stand: Min assist;Mod assist;+2 physical assistance;+2 safety/equipment         General transfer comment: increased multimodal cues to direct pt  Ambulation/Gait Ambulation/Gait assistance: Min assist;+2 physical assistance Gait Distance (Feet): 200 Feet Assistive device: Rolling walker (2 wheeled) Gait Pattern/deviations: Decreased step length - right;Decreased step length - left;Step-through pattern;Shuffle;Staggering  left;Staggering right;Trunk flexed;Narrow base of support;Scissoring Gait velocity: decr   General Gait Details: increased multimodal cues to gain pt participation and follow through.  Pt unsteady all directions and with ltd insight into deficits   Stairs             Wheelchair Mobility    Modified Rankin (Stroke Patients Only)       Balance Overall balance assessment: Needs assistance Sitting-balance support: Bilateral upper extremity supported Sitting balance-Leahy Scale: Fair     Standing balance support: Bilateral upper extremity supported Standing balance-Leahy Scale: Poor                              Cognition Arousal/Alertness: Awake/alert Behavior During Therapy: Anxious;Impulsive Overall Cognitive Status: Impaired/Different from baseline Area of Impairment: Following commands;Safety/judgement;Awareness;Problem solving                       Following Commands: Follows one step commands inconsistently Safety/Judgement: Decreased awareness of safety;Decreased awareness of deficits            Exercises      General Comments        Pertinent Vitals/Pain Pain Assessment: Faces Faces Pain Scale: Hurts little more Pain Location: L hand, back, neck, all over Pain Descriptors / Indicators: Sore;Tender Pain Intervention(s): Limited activity within patient's tolerance;Monitored during session    Home Living                      Prior Function            PT Goals (current goals can now be found in the care plan section) Acute  Rehab PT Goals Patient Stated Goal: Pt unable to formulate goals PT Goal Formulation: Patient unable to participate in goal setting Time For Goal Achievement: 06/27/19 Potential to Achieve Goals: Fair Progress towards PT goals: Progressing toward goals    Frequency    Min 3X/week      PT Plan Discharge plan needs to be updated    Co-evaluation              AM-PAC PT "6 Clicks"  Mobility   Outcome Measure  Help needed turning from your back to your side while in a flat bed without using bedrails?: A Little Help needed moving from lying on your back to sitting on the side of a flat bed without using bedrails?: A Little Help needed moving to and from a bed to a chair (including a wheelchair)?: A Lot Help needed standing up from a chair using your arms (e.g., wheelchair or bedside chair)?: A Lot Help needed to walk in hospital room?: A Little Help needed climbing 3-5 steps with a railing? : A Lot 6 Click Score: 15    End of Session Equipment Utilized During Treatment: Gait belt Activity Tolerance: Patient tolerated treatment well Patient left: in chair;with call bell/phone within reach;with chair alarm set;with family/visitor present Nurse Communication: Mobility status PT Visit Diagnosis: Unsteadiness on feet (R26.81);Difficulty in walking, not elsewhere classified (R26.2)     Time: 3790-2409 PT Time Calculation (min) (ACUTE ONLY): 25 min  Charges:  $Gait Training: 23-37 mins                     [place] Pager [phone] Office [phone]    Hanae Waiters 06/14/2019, 3:15 PM

## 2019-06-15 LAB — CBC
HCT: 36.5 % (ref 36.0–46.0)
Hemoglobin: 11.7 g/dL — ABNORMAL LOW (ref 12.0–15.0)
MCH: 30.5 pg (ref 26.0–34.0)
MCHC: 32.1 g/dL (ref 30.0–36.0)
MCV: 95.1 fL (ref 80.0–100.0)
Platelets: 193 10*3/uL (ref 150–400)
RBC: 3.84 MIL/uL — ABNORMAL LOW (ref 3.87–5.11)
RDW: 13.8 % (ref 11.5–15.5)
WBC: 5.3 10*3/uL (ref 4.0–10.5)
nRBC: 0 % (ref 0.0–0.2)

## 2019-06-15 LAB — BASIC METABOLIC PANEL
Anion gap: 7 (ref 5–15)
BUN: 13 mg/dL (ref 8–23)
CO2: 23 mmol/L (ref 22–32)
Calcium: 8.3 mg/dL — ABNORMAL LOW (ref 8.9–10.3)
Chloride: 109 mmol/L (ref 98–111)
Creatinine, Ser: 0.67 mg/dL (ref 0.44–1.00)
GFR calc Af Amer: >60 mL/min (ref >60)
GFR calc non Af Amer: >60 mL/min (ref >60)
Glucose, Bld: 89 mg/dL (ref 70–99)
Potassium: 3.6 mmol/L (ref 3.5–5.1)
Sodium: 139 mmol/L (ref 135–145)

## 2019-06-15 LAB — SARS CORONAVIRUS 2 (TAT 6-24 HRS): SARS Coronavirus 2: NEGATIVE

## 2019-06-15 LAB — URINE CULTURE: Culture: >=100000 — AB

## 2019-06-15 MED ORDER — LORAZEPAM 2 MG/ML IJ SOLN
0.5000 mg | Freq: Once | INTRAMUSCULAR | Status: AC
  Administered 2019-06-15: 0.5 mg via INTRAVENOUS
  Filled 2019-06-15 (×2): qty 1

## 2019-06-15 MED ORDER — CEPHALEXIN 500 MG PO CAPS
500.0000 mg | ORAL_CAPSULE | Freq: Two times a day (BID) | ORAL | Status: DC
  Administered 2019-06-15 – 2019-06-17 (×4): 500 mg via ORAL
  Filled 2019-06-15 (×5): qty 1

## 2019-06-15 MED ORDER — HALOPERIDOL LACTATE 5 MG/ML IJ SOLN
2.0000 mg | Freq: Four times a day (QID) | INTRAMUSCULAR | Status: DC | PRN
  Administered 2019-06-15 – 2019-06-16 (×2): 2 mg via INTRAVENOUS
  Filled 2019-06-15 (×2): qty 1

## 2019-06-15 NOTE — Plan of Care (Signed)
  Problem: Clinical Measurements: Goal: Ability to maintain clinical measurements within normal limits will improve Outcome: Progressing Goal: Will remain free from infection Outcome: Progressing Goal: Diagnostic test results will improve Outcome: Progressing Goal: Respiratory complications will improve Outcome: Progressing Goal: Cardiovascular complication will be avoided Outcome: Progressing   Problem: Nutrition: Goal: Adequate nutrition will be maintained Outcome: Progressing   Problem: Activity: Goal: Risk for activity intolerance will decrease Outcome: Not Progressing

## 2019-06-15 NOTE — Progress Notes (Signed)
PROGRESS NOTE  Alison King JME:[id number] DOB: 1940-10-26 DOA: 06/12/2019 PCP: April Manson, NP   LOS: 2 days   Brief Narrative / Interim history: 79 year old female with history of dementia, chronic back pain, paroxysmal A. fib, hypertension, hyperlipidemia, who came in from independent living with increased confusion.  Over the last several days she is also been having poor p.o. intake as well as increased falls.  It was noted that she had foul-smelling urine at her facility.  Initial work-up in the ED was pertinent for urinary tract infection, she was given ceftriaxone and admitted to the hospital.   Subjective / 24h Interval events: Remains confused but appears better on my evaluation, she is more coherent and answering questions more appropriately  Assessment & Plan: Principal Problem Acute metabolic encephalopathy on underlying dementia -Worsened likely in the setting of urinary tract infection -Improved today compared to yesterday, no longer requiring restraints, continue frequent reorientation and supportive treatment  Active Problems Urinary tract infection -Continue ceftriaxone, urine cultures with E. coli which is resistant to Cipro but otherwise pansensitive.  Will change to Keflex  Disposition/recurrent falls -PT recommending SNF, discussed with SW, patient [facility], will repeat Covid test  Afib  -Continue metoprolol, she no longer takes Eliquis per pharmacy  HTN  -Continue lisinopril, metoprolol, blood pressure stable  GERD  -cont PPI  Hypothyroid -Continue Synthroid, TSH borderline at 0.309, continue same dose  Scheduled Meds: . levothyroxine  88 mcg Oral QAC breakfast  . lisinopril  20 mg Oral Daily  . LORazepam  0.5 mg Intramuscular Once  . metoprolol tartrate  50 mg Oral BID  . pantoprazole  80 mg Oral Daily  . QUEtiapine  25 mg Oral Daily   And  . QUEtiapine  50 mg Oral QHS  . sertraline   75 mg Oral Daily   Continuous Infusions: . cefTRIAXone (ROCEPHIN)  IV Stopped (06/14/19 1812)   PRN Meds:.acetaminophen **OR** acetaminophen, polyethylene glycol, traZODone  DVT prophylaxis: On Eliquis Code Status: Full code Family Communication: d/w son and daughter in law at bedside  Patient admitted from: ALF Anticipated d/c place: TBD Barriers to d/c: persistent metabolic encephalopathy  Consultants:  None   Procedures:  None   Microbiology  E coli in urine   Antimicrobials: Ceftriaxone 3/6 >> 3/8 Keflex 3/8 >> plan for total of 5 days   Objective: Vitals:   06/14/19 1456 06/14/19 1532 06/14/19 2254 06/15/19 0653  BP: 117/66  (!) 116/54 (!) 121/59  Pulse: 71  67 68  Resp: 16  16 16   Temp:    (!) 97.4 F (36.3 C)  TempSrc:    Oral  SpO2: 94%  99% 93%  Weight:      Height:  5\' 5"  (1.651 m)      Intake/Output Summary (Last 24 hours) at 06/15/2019 0948 Last data filed at 06/15/2019 0300 Gross per 24 hour  Intake 215.08 ml  Output --  Net 215.08 ml   Filed Weights   06/13/19 0700  Weight: 72.5 kg    Examination:  Constitutional: No distress, confused but more appropriate Eyes: No scleral icterus ENMT: Moist mucous membranes Neck: normal, supple Respiratory: Clear bilaterally, no wheezing, no crackles, normal respiratory effort Cardiovascular: Regular rate and rhythm, no murmurs, no edema Abdomen: Soft, nontender, nondistended, bowel sounds positive Musculoskeletal: no clubbing / cyanosis.  Skin: No rashes seen Neurologic: No focal deficits but does not follow commands consistently  Data Reviewed: I have independently reviewed following labs  and imaging studies   CBC: Recent Labs  Lab 06/12/19 2300 06/13/19 0652 06/15/19 0528  WBC 5.2 7.7 5.3  NEUTROABS 2.9  --   --   HGB 12.5 13.0 11.7*  HCT 38.7 41.7 36.5  MCV 94.9 95.0 95.1  PLT 201 194 193   Basic Metabolic Panel: Recent Labs  Lab 06/12/19 2129 06/13/19 0652 06/15/19 0528  NA 138  140 139  K 4.3 4.0 3.6  CL 103 108 109  CO2 27 24 23   GLUCOSE 100* 91 89  BUN 20 17 13   CREATININE 0.73 0.68 0.67  CALCIUM 9.3 8.7* 8.3*   Liver Function Tests: Recent Labs  Lab 06/12/19 2129 06/13/19 0652  AST 20 21  ALT 24 20  ALKPHOS 86 74  BILITOT 0.7 0.6  PROT 7.1 6.2*  ALBUMIN 4.0 3.4*   Coagulation Profile: No results for input(s): INR, PROTIME in the last 168 hours. HbA1C: No results for input(s): HGBA1C in the last 72 hours. CBG: No results for input(s): GLUCAP in the last 168 hours.  Recent Results (from the past 240 hour(s))  Urine culture     Status: Abnormal   Collection Time: 06/12/19  9:29 PM   Specimen: Urine, Random  Result Value Ref Range Status   Specimen Description   Final    URINE, RANDOM Performed at [facility], [address] [place], [place]    Special Requests   Final    NONE Performed at [facility], [address] [place], [place]    Culture >=100,000 COLONIES/mL ESCHERICHIA COLI (A)  Final   Report Status 06/15/2019 FINAL  Final   Organism ID, Bacteria ESCHERICHIA COLI (A)  Final      Susceptibility   Escherichia coli - MIC*    AMPICILLIN <=2 SENSITIVE Sensitive     CEFAZOLIN <=4 SENSITIVE Sensitive     CEFTRIAXONE <=0.25 SENSITIVE Sensitive     CIPROFLOXACIN >=4 RESISTANT Resistant     GENTAMICIN <=1 SENSITIVE Sensitive     IMIPENEM <=0.25 SENSITIVE Sensitive     NITROFURANTOIN <=16 SENSITIVE Sensitive     TRIMETH/SULFA <=20 SENSITIVE Sensitive     AMPICILLIN/SULBACTAM <=2 SENSITIVE Sensitive     PIP/TAZO <=4 SENSITIVE Sensitive     * >=100,000 COLONIES/mL ESCHERICHIA COLI  Blood culture (routine x 2)     Status: None (Preliminary result)   Collection Time: 06/12/19  9:30 PM   Specimen: BLOOD  Result Value Ref Range Status   Specimen Description   Final    BLOOD RIGHT ANTECUBITAL Performed at [facility], [address] [place], [place]  [place]    Special Requests   Final    BOTTLES DRAWN AEROBIC AND ANAEROBIC Blood Culture results may not be optimal due to an inadequate volume of blood received in culture bottles Performed at [facility], [address] [place], [place]    Culture   Final    NO GROWTH 2 DAYS Performed at [facility], [address], [place], [address] [place]    Report Status PENDING  Incomplete  Blood culture (routine x 2)     Status: None (Preliminary result)   Collection Time: 06/12/19  9:40 PM   Specimen: BLOOD RIGHT HAND  Result Value Ref Range Status   Specimen Description   Final    BLOOD RIGHT HAND Performed at [facility], [address], [place], [place]    Special  Requests   Final    BOTTLES DRAWN AEROBIC ONLY Blood Culture results may not be optimal due to an inadequate volume of blood received in culture bottles Performed at [place] [address], [place], [place] [address]    Culture   Final    NO GROWTH 2 DAYS Performed at [facility] [address] [place], [place] [address]    Report Status PENDING  Incomplete  SARS CORONAVIRUS 2 (TAT 6-24 HRS) Nasopharyngeal Nasopharyngeal Swab     Status: None   Collection Time: 06/12/19 11:12 PM   Specimen: Nasopharyngeal Swab  Result Value Ref Range Status   SARS Coronavirus 2 NEGATIVE NEGATIVE Final    Comment: (NOTE) SARS-CoV-2 target nucleic acids are NOT DETECTED. The SARS-CoV-2 RNA is generally detectable in upper and lower respiratory specimens during the acute phase of infection. Negative results do not preclude SARS-CoV-2 infection, do not rule out co-infections with other pathogens, and should not be used as the sole basis for treatment or other patient management decisions. Negative results must be combined with clinical observations, patient history, and epidemiological information. The expected result is Negative. Fact  Sheet for Patients: SugarRoll.be Fact Sheet for Healthcare Providers: https://www.woods-mathews.com/ This test is not yet approved or cleared by the [place] FDA and  has been authorized for detection and/or diagnosis of SARS-CoV-2 by FDA under an Emergency Use Authorization (EUA). This EUA will remain  in effect (meaning this test can be used) for the duration of the COVID-19 declaration under Section 56 4(b)(1) of the Act, 21 U.S.C. section 360bbb-3(b)(1), unless the authorization is terminated or revoked sooner. Performed at [facility], [place] [address], [place], [place] [address]      Radiology Studies: No results found.  Marzetta Board, MD, PhD [facility]  Time spent: 35 minutes, more than 50% at bedside and in discussions with the patient, the patient's daughter in law at bedside, as well as the patient's son over the phone  Between 7 am - 7 pm I am available, please contact me via Amion or Securechat  Between 7 pm - 7 am I am not available, please contact night coverage MD/APP via Amion

## 2019-06-15 NOTE — TOC Initial Note (Signed)
Transition of Care [facility]) - Initial/Assessment Note    Patient Details  Name: Alison King MRN: [id number] Date of Birth: 09/27/1940  Transition [facility]) CM/SW Contact:    Alison Bill, RN Phone Number: 06/15/2019, 1:41 PM  Clinical Narrative:                 Pt from [facility]. Per PT eval, SNF is recommended at this time. Pt with dementia so, DIL Alison King contacted (POA with son) for dc planning. Alison King states that pt independent living has a rehab side and they would like for pt to go there [facility]). This CM contacted [facility] liaison and requested that pt come there for short term rehab. FL2 and therapy notes faxed via hub. Per liaison pt should be able to come to [facility] on 3/9. Covid testing ordered and additional information was submitted to [facility] per their request. TOC will continue to follow.   Expected Discharge Plan: Skilled Nursing Facility Barriers to Discharge: Continued Medical Work up   Patient Goals and CMS Choice     Choice offered to / list presented to : Alison King  Expected Discharge Plan and Services Expected Discharge Plan: Skilled Nursing Facility   Discharge Planning Services: CM Consult   Living arrangements for the past 2 months: Independent Living Facility                   Prior Living Arrangements/Services Living arrangements for the past 2 months: Independent Living Facility Lives with:: Spouse Patient language and need for interpreter reviewed:: Yes        Need for Family Participation in Patient Care: Yes (Comment) Care giver support system in place?: Yes (comment)      Activities of Daily Living Home Assistive Devices/Equipment: Dan Humphreys (specify type) ADL Screening (condition at time of admission) Patient's cognitive ability adequate to safely complete daily activities?: No Is the patient deaf or have difficulty hearing?: No Does the patient have difficulty seeing, even when wearing glasses/contacts?:  No Does the patient have difficulty concentrating, remembering, or making decisions?: Yes Patient able to express need for assistance with ADLs?: No Does the patient have difficulty dressing or bathing?: Yes Independently performs ADLs?: No Communication: Independent Dressing (OT): Needs assistance Is this a change from baseline?: Pre-admission baseline Grooming: Needs assistance Is this a change from baseline?: Pre-admission baseline Feeding: Needs assistance Is this a change from baseline?: Pre-admission baseline Bathing: Needs assistance Is this a change from baseline?: Pre-admission baseline Toileting: Needs assistance Is this a change from baseline?: Pre-admission baseline Walks in Home: Needs assistance Is this a change from baseline?: Pre-admission baseline Does the patient have difficulty walking or climbing stairs?: Yes Weakness of Legs: Both Weakness of Arms/Hands: Both  Permission Sought/Granted Permission sought to share information with : Photographer granted to share info w AGENCY: [facility] ([facility])        Emotional Assessment Appearance:: Appears stated age     Orientation: : Oriented to Self      Admission diagnosis:  Decreased oral intake [R63.8] Acute cystitis without hematuria [N30.00] Fall, initial encounter [W19.XXXA] Altered mental status, unspecified altered mental status type [R41.82] Fatigue, unspecified type [R53.83] AMS (altered mental status) [R41.82] Patient Active Problem List   Diagnosis Date Noted  . Acute lower UTI 06/13/2019  . AMS (altered mental status) 06/13/2019   PCP:  April Manson, NP Pharmacy:   [facility] [phone] - [place], [place] -  [address] [place] [address] Phone: [phone] Fax: [phone]     Social Determinants of Health (SDOH) Interventions    Readmission Risk Interventions No flowsheet data found.

## 2019-06-15 NOTE — NC FL2 (Signed)
[place] LEVEL OF CARE SCREENING TOOL     IDENTIFICATION  Patient Name: Alison King Birthdate: 1940/12/25 Sex: female Admission Date (Current Location): 06/12/2019  [facility] and [place] Number:  Herbalist and Address:  [facility],  [place], [place]      Provider Number: [id number]  Attending Physician Name and Address:  Caren Griffins, MD  Relative Name and Phone Number:  Velva Molinari [phone]    Current Level of Care: Hospital Recommended Level of Care: [place] Prior Approval Number:    Date Approved/Denied:   PASRR Number: pending  Discharge Plan: SNF    Current Diagnoses: Patient Active Problem List   Diagnosis Date Noted  . Acute lower UTI 06/13/2019  . AMS (altered mental status) 06/13/2019    Orientation RESPIRATION BLADDER Height & Weight     Self  Normal Continent Weight: 72.5 kg Height:  5\' 5"  (165.1 cm)  BEHAVIORAL SYMPTOMS/MOOD NEUROLOGICAL BOWEL NUTRITION STATUS      Continent Diet(Heart healthy)  AMBULATORY STATUS COMMUNICATION OF NEEDS Skin   Limited Assist Verbally Bruising                       Personal Care Assistance Level of Assistance  Bathing, Dressing Bathing Assistance: Limited assistance   Dressing Assistance: Limited assistance     Functional Limitations Info             SPECIAL CARE FACTORS FREQUENCY  PT (By licensed PT), OT (By licensed OT)     PT Frequency: 5 x weekly OT Frequency: 5 x weekly            Contractures      Additional Factors Info  Code Status, Allergies Code Status Info: DNR Allergies Info: Atorvastatin Calcium, Sulfa antibiotics           Current Medications (06/15/2019):  This is the current hospital active medication list Current Facility-Administered Medications  Medication Dose Route Frequency Provider Last Rate Last Admin  . acetaminophen (TYLENOL) tablet 650 mg  650 mg Oral Q6H PRN  Clarnce Flock, MD   650 mg at 06/14/19 1451   Or  . acetaminophen (TYLENOL) suppository 650 mg  650 mg Rectal Q6H PRN Clarnce Flock, MD      . cephALEXin (KEFLEX) capsule 500 mg  500 mg Oral Q12H Caren Griffins, MD      . levothyroxine (SYNTHROID) tablet 88 mcg  88 mcg Oral QAC breakfast Clarnce Flock, MD   88 mcg at 06/15/19 0600  . lisinopril (ZESTRIL) tablet 20 mg  20 mg Oral Daily Clarnce Flock, MD   20 mg at 06/15/19 0955  . LORazepam (ATIVAN) injection 0.5 mg  0.5 mg Intramuscular Once Blount, Scarlette Shorts T, NP      . metoprolol tartrate (LOPRESSOR) tablet 50 mg  50 mg Oral BID Clarnce Flock, MD   50 mg at 06/15/19 0956  . pantoprazole (PROTONIX) EC tablet 80 mg  80 mg Oral Daily Clarnce Flock, MD   80 mg at 06/15/19 9518  . polyethylene glycol (MIRALAX / GLYCOLAX) packet 17 g  17 g Oral Daily PRN Clarnce Flock, MD      . QUEtiapine (SEROQUEL) tablet 25 mg  25 mg Oral Daily Caren Griffins, MD   25 mg at 06/15/19 0956   And  . QUEtiapine (SEROQUEL) tablet 50 mg  50 mg Oral QHS Gherghe, Costin M,  MD   50 mg at 06/14/19 2226  . sertraline (ZOLOFT) tablet 75 mg  75 mg Oral Daily Venora Maples, MD   75 mg at 06/15/19 0955  . traZODone (DESYREL) tablet 25 mg  25 mg Oral QHS PRN Venora Maples, MD         Discharge Medications: Please see discharge summary for a list of discharge medications.  Relevant Imaging Results:  Relevant Lab Results:   Additional Information ss# [id number]  Alison King, Meriam Sprague, RN

## 2019-06-16 NOTE — Progress Notes (Addendum)
Transition of Care (TOC) -30 day Note       Patient Details  Name: Alison King MRN: [id number] Date of Birth: Jul 11, 1940   Transition [facility] Care [facility]) CM/SW Contact  Name: Sandford Craze  Phone Number: [phone] Date:06/16/2019 [id number]   MUST ID: [id number]   To Whom it May Concern:   Please be advised that the above patient will require a short-term nursing home stay, anticipated 30 days or less rehabilitation and strengthening. The plan is for return home.

## 2019-06-16 NOTE — TOC Transition Note (Signed)
Transition of Care [facility]) - CM/SW Discharge Note   Patient Details  Name: Alison King MRN: [id number] Date of Birth: 11-30-1940  Transition of Care [facility]) CM/SW Contact:  Jaydah Stahle, Meriam Sprague, RN Phone Number: 06/16/2019, 10:26 AM   Clinical Narrative:     Per SNF liaison pt can not come to SNF with being in restraints in the last 24 hours. Pt was in mittens this am. This was discussed with RN who will do a trial out if assessed to be safe.

## 2019-06-16 NOTE — Care Management Important Message (Signed)
Important Message  Patient Details IM Letter given to Sandford Craze RN Case Manager to present to the Patient Name: Alison King MRN: [id number] Date of Birth: 1940-12-24   Medicare Important Message Given:  Yes     Caren Macadam 06/16/2019, 10:31 AM

## 2019-06-16 NOTE — Progress Notes (Signed)
PROGRESS NOTE  Alison King TDH:[id number] DOB: 1940/10/11 DOA: 06/12/2019 PCP: April Manson, NP   LOS: 3 days   Brief Narrative / Interim history: 79 year old female with history of dementia, chronic back pain, paroxysmal A. fib, hypertension, hyperlipidemia, who came in from independent living with increased confusion.  Over the last several days she is also been having poor p.o. intake as well as increased falls.  It was noted that she had foul-smelling urine at her facility.  Initial work-up in the ED was pertinent for urinary tract infection, she was given ceftriaxone and admitted to the hospital.   Subjective / 24h Interval events: Confused this morning, mittens are on  Assessment & Plan: Principal Problem Acute metabolic encephalopathy on underlying dementia -Worsened likely in the setting of urinary tract infection as well as hospital stay -Overall improved, yesterday she had a good day but today she is wearing mittens.  Continue supportive treatment -Due to the fact that she had maintenance unable to discharge to SNF today -Continue Haldol as needed, scheduled Seroquel  Active Problems Urinary tract infection -Continue ceftriaxone, urine cultures with E. coli which is resistant to Cipro but otherwise pansensitive.  Changed Keflex on 3/8 -Plan for total of 5 days  Disposition/recurrent falls -PT recommending SNF, awaiting placement to [facility] as long as she does not require any form of restraints including mittens  Afib  -Continue metoprolol, she no longer takes Eliquis per pharmacy  HTN  -Continue lisinopril, metoprolol, blood pressure stable  GERD  -cont PPI  Hypothyroid -Continue Synthroid, TSH borderline at 0.309, continue same dose  Scheduled Meds: . cephALEXin  500 mg Oral Q12H  . levothyroxine  88 mcg Oral QAC breakfast  . lisinopril  20 mg Oral Daily  . metoprolol tartrate  50 mg Oral BID  . pantoprazole  80 mg Oral Daily  . QUEtiapine   25 mg Oral Daily   And  . QUEtiapine  50 mg Oral QHS  . sertraline  75 mg Oral Daily   Continuous Infusions:  PRN Meds:.acetaminophen **OR** acetaminophen, haloperidol lactate, polyethylene glycol, traZODone  DVT prophylaxis: On Eliquis Code Status: Full code Family Communication: d/w daughter in law at bedside on 3/8 Patient admitted from: ALF Anticipated d/c place: SNF Barriers to d/c: Continue to require agitation treatment, pending placement to SNF once no longer requiring restraints  Consultants:  None   Procedures:  None   Microbiology  E coli in urine   Antimicrobials: Ceftriaxone 3/6 >> 3/8 Keflex 3/8 >> plan for total of 5 days   Objective: Vitals:   06/15/19 0900 06/15/19 1418 06/15/19 1954 06/16/19 0524  BP: 128/62 110/61 132/78 128/70  Pulse: 70 72 75 74  Resp:  16 19 18   Temp:  97.7 F (36.5 C) 97.6 F (36.4 C) (!) 97.4 F (36.3 C)  TempSrc:  Oral Oral Oral  SpO2:  95% 98% 95%  Weight:      Height:        Intake/Output Summary (Last 24 hours) at 06/16/2019 0959 Last data filed at 06/15/2019 1820 Gross per 24 hour  Intake 120 ml  Output 400 ml  Net -280 ml   Filed Weights   06/13/19 0700  Weight: 72.5 kg    Examination:  Constitutional: No distress, confused, pleasant Eyes: No icterus ENMT: Moist mucous membranes Neck: normal, supple Respiratory: Clear bilaterally without wheezing Cardiovascular: Regular rate and rhythm, no murmurs, no edema Abdomen: Soft, NT, ND, bowel sounds positive Musculoskeletal: no clubbing / cyanosis.  Skin:  No rashes  Data Reviewed: I have independently reviewed following labs and imaging studies   CBC: Recent Labs  Lab 06/12/19 2300 06/13/19 0652 06/15/19 0528  WBC 5.2 7.7 5.3  NEUTROABS 2.9  --   --   HGB 12.5 13.0 11.7*  HCT 38.7 41.7 36.5  MCV 94.9 95.0 95.1  PLT 201 194 193   Basic Metabolic Panel: Recent Labs  Lab 06/12/19 2129 06/13/19 0652 06/15/19 0528  NA 138 140 139  K 4.3 4.0 3.6   CL 103 108 109  CO2 27 24 23   GLUCOSE 100* 91 89  BUN 20 17 13   CREATININE 0.73 0.68 0.67  CALCIUM 9.3 8.7* 8.3*   Liver Function Tests: Recent Labs  Lab 06/12/19 2129 06/13/19 0652  AST 20 21  ALT 24 20  ALKPHOS 86 74  BILITOT 0.7 0.6  PROT 7.1 6.2*  ALBUMIN 4.0 3.4*   Coagulation Profile: No results for input(s): INR, PROTIME in the last 168 hours. HbA1C: No results for input(s): HGBA1C in the last 72 hours. CBG: No results for input(s): GLUCAP in the last 168 hours.  Recent Results (from the past 240 hour(s))  Urine culture     Status: Abnormal   Collection Time: 06/12/19  9:29 PM   Specimen: Urine, Random  Result Value Ref Range Status   Specimen Description   Final    URINE, RANDOM Performed at [facility], [address] [place], [place]    Special Requests   Final    NONE Performed at [facility], [address] [place], [place]    Culture >=100,000 COLONIES/mL ESCHERICHIA COLI (A)  Final   Report Status 06/15/2019 FINAL  Final   Organism ID, Bacteria ESCHERICHIA COLI (A)  Final      Susceptibility   Escherichia coli - MIC*    AMPICILLIN <=2 SENSITIVE Sensitive     CEFAZOLIN <=4 SENSITIVE Sensitive     CEFTRIAXONE <=0.25 SENSITIVE Sensitive     CIPROFLOXACIN >=4 RESISTANT Resistant     GENTAMICIN <=1 SENSITIVE Sensitive     IMIPENEM <=0.25 SENSITIVE Sensitive     NITROFURANTOIN <=16 SENSITIVE Sensitive     TRIMETH/SULFA <=20 SENSITIVE Sensitive     AMPICILLIN/SULBACTAM <=2 SENSITIVE Sensitive     PIP/TAZO <=4 SENSITIVE Sensitive     * >=100,000 COLONIES/mL ESCHERICHIA COLI  Blood culture (routine x 2)     Status: None (Preliminary result)   Collection Time: 06/12/19  9:30 PM   Specimen: BLOOD  Result Value Ref Range Status   Specimen Description   Final    BLOOD RIGHT ANTECUBITAL Performed at [facility], [address] [place], [place]    Special  Requests   Final    BOTTLES DRAWN AEROBIC AND ANAEROBIC Blood Culture results may not be optimal due to an inadequate volume of blood received in culture bottles Performed at [facility], [address] [place], [place]    Culture   Final    NO GROWTH 2 DAYS Performed at [facility], [address] [place], [address] [place]    Report Status PENDING  Incomplete  Blood culture (routine x 2)     Status: None (Preliminary result)   Collection Time: 06/12/19  9:40 PM   Specimen: BLOOD RIGHT HAND  Result Value Ref Range Status   Specimen Description   Final    BLOOD RIGHT HAND Performed at [facility],  [place] [address], [place], [place] [address]    Special Requests   Final    BOTTLES DRAWN AEROBIC ONLY Blood Culture results may not be optimal due to an inadequate volume of blood received in culture bottles Performed at [place] [address] [place], [place] [address]    Culture   Final    NO GROWTH 2 DAYS Performed at [facility] [address] [place], [place] [address]    Report Status PENDING  Incomplete  SARS CORONAVIRUS 2 (TAT 6-24 HRS) Nasopharyngeal Nasopharyngeal Swab     Status: None   Collection Time: 06/12/19 11:12 PM   Specimen: Nasopharyngeal Swab  Result Value Ref Range Status   SARS Coronavirus 2 NEGATIVE NEGATIVE Final    Comment: (NOTE) SARS-CoV-2 target nucleic acids are NOT DETECTED. The SARS-CoV-2 RNA is generally detectable in upper and lower respiratory specimens during the acute phase of infection. Negative results do not preclude SARS-CoV-2 infection, do not rule out co-infections with other pathogens, and should not be used as the sole basis for treatment or other patient management decisions. Negative results must be combined with clinical observations, patient history, and epidemiological information. The expected result is Negative. Fact Sheet for  Patients: SugarRoll.be Fact Sheet for Healthcare Providers: https://www.woods-mathews.com/ This test is not yet approved or cleared by the [place] FDA and  has been authorized for detection and/or diagnosis of SARS-CoV-2 by FDA under an Emergency Use Authorization (EUA). This EUA will remain  in effect (meaning this test can be used) for the duration of the COVID-19 declaration under Section 56 4(b)(1) of the Act, 21 U.S.C. section 360bbb-3(b)(1), unless the authorization is terminated or revoked sooner. Performed at [facility], [place] [address] [place], [place] [address]   SARS CORONAVIRUS 2 (TAT 6-24 HRS) Nasopharyngeal Nasopharyngeal Swab     Status: None   Collection Time: 06/15/19 12:18 PM   Specimen: Nasopharyngeal Swab  Result Value Ref Range Status   SARS Coronavirus 2 NEGATIVE NEGATIVE Final    Comment: (NOTE) SARS-CoV-2 target nucleic acids are NOT DETECTED. The SARS-CoV-2 RNA is generally detectable in upper and lower respiratory specimens during the acute phase of infection. Negative results do not preclude SARS-CoV-2 infection, do not rule out co-infections with other pathogens, and should not be used as the sole basis for treatment or other patient management decisions. Negative results must be combined with clinical observations, patient history, and epidemiological information. The expected result is Negative. Fact Sheet for Patients: SugarRoll.be Fact Sheet for Healthcare Providers: https://www.woods-mathews.com/ This test is not yet approved or cleared by the [place] FDA and  has been authorized for detection and/or diagnosis of SARS-CoV-2 by FDA under an Emergency Use Authorization (EUA). This EUA will remain  in effect (meaning this test can be used) for the duration of the COVID-19 declaration under Section 56 4(b)(1) of the Act, 21 U.S.C. section  360bbb-3(b)(1), unless the authorization is terminated or revoked sooner. Performed at [facility], [place] [address], [place], [place] [address]      Radiology Studies: No results found.  Marzetta Board, MD, PhD [facility]  Time spent: 35 minutes, more than 50% at bedside and in discussions with the patient, the patient's daughter in law at bedside, as well as the patient's son over the phone  Between 7 am - 7 pm I am available, please contact me via Amion or Securechat  Between 7 pm - 7 am I am not available,  please contact night coverage MD/APP via Amion

## 2019-06-17 LAB — BASIC METABOLIC PANEL
Anion gap: 9 (ref 5–15)
BUN: 17 mg/dL (ref 8–23)
CO2: 23 mmol/L (ref 22–32)
Calcium: 8.6 mg/dL — ABNORMAL LOW (ref 8.9–10.3)
Chloride: 108 mmol/L (ref 98–111)
Creatinine, Ser: 0.6 mg/dL (ref 0.44–1.00)
GFR calc Af Amer: >60 mL/min (ref >60)
GFR calc non Af Amer: >60 mL/min (ref >60)
Glucose, Bld: 89 mg/dL (ref 70–99)
Potassium: 3.9 mmol/L (ref 3.5–5.1)
Sodium: 140 mmol/L (ref 135–145)

## 2019-06-17 LAB — CBC
HCT: 38.5 % (ref 36.0–46.0)
Hemoglobin: 12.3 g/dL (ref 12.0–15.0)
MCH: 29.9 pg (ref 26.0–34.0)
MCHC: 31.9 g/dL (ref 30.0–36.0)
MCV: 93.4 fL (ref 80.0–100.0)
Platelets: 247 10*3/uL (ref 150–400)
RBC: 4.12 MIL/uL (ref 3.87–5.11)
RDW: 13.3 % (ref 11.5–15.5)
WBC: 6.3 10*3/uL (ref 4.0–10.5)
nRBC: 0 % (ref 0.0–0.2)

## 2019-06-17 MED ORDER — TRAZODONE HCL 50 MG PO TABS: 25.0000 mg | ORAL_TABLET | Freq: Every evening | ORAL | 0 refills | Status: AC | PRN

## 2019-06-17 MED ORDER — QUETIAPINE FUMARATE 25 MG PO TABS: 25.0000 mg | ORAL_TABLET | Freq: Every day | ORAL | 0 refills | Status: AC

## 2019-06-17 MED ORDER — QUETIAPINE FUMARATE 50 MG PO TABS: 50.0000 mg | ORAL_TABLET | Freq: Every day | ORAL | 0 refills | Status: AC

## 2019-06-17 MED ORDER — CEPHALEXIN 500 MG PO CAPS: 500.0000 mg | ORAL_CAPSULE | Freq: Two times a day (BID) | ORAL | 0 refills | Status: AC

## 2019-06-17 NOTE — Progress Notes (Signed)
Daughter-in-law has been notified of discharge.

## 2019-06-17 NOTE — Discharge Summary (Signed)
Physician Discharge Summary  Alison King HLK:[id number] DOB: November 27, 1940 DOA: 06/12/2019  PCP: April Manson, NP  Admit date: 06/12/2019 Discharge date: 06/17/2019  Admitted From: ALF Disposition:  SNF  Recommendations for Outpatient Follow-up:  1. Follow up with PCP in 1-2 weeks 2. Please obtain BMP/CBC in one week 3. Encourage oral intake    Discharge Condition: Stable.  CODE STATUS: DNR Diet recommendation: Heart Healthy  Brief/Interim Summary: 79 year old with past medical history significant for dementia, chronic back pain, paroxysmal A. fib, hypertension, hyperlipidemia who came from independent living with increased confusion.  Over the last several days patient has been having poor oral intake as well as increased falls.  He was noted that she had a foul-smelling urine at her facility.  Work-up in the ED was pertinent for urinary tract infection, she was given ceftriaxone and admitted to the hospital.  1-Acute metabolic encephalopathy on underlying dementia: -Worsening mental status due to urinary tract infection as well as hospital delirium. -Patient mental status improved, she is off of mittens.  Continue with support care and frequent orientation. -Continue with scheduled Seroquel. -Stable to be transferred to a skilled facility today.  2-Urinary tract infection: She was treated with ceftriaxone, urine culture grew with E. coli resistant to ciprofloxacin but otherwise pansensitive. He was a started on Keflex on 3/8 She will be discharged on 2 more days of Keflex.  3-recurrent fall: PT recommending skilled nursing facility.  4-A. fib: Continue with metoprolol.  No longer taking Eliquis.  5-HTN; continue with lisinopril and metoprolol.  Hypothyroidism.  Continue with Synthroid    Discharge Diagnoses:  Active Problems:   Acute lower UTI   AMS (altered mental status)    Discharge Instructions  Discharge Instructions    Diet - low sodium heart healthy    Complete by: As directed    Increase activity slowly   Complete by: As directed      Allergies as of 06/17/2019      Reactions   Atorvastatin Calcium    Forgetful, foggy, leg cramps, off balance   Sulfa Antibiotics Nausea Only      Medication List    STOP taking these medications   LORazepam 0.5 MG tablet Commonly known as: ATIVAN     TAKE these medications   cephALEXin 500 MG capsule Commonly known as: KEFLEX Take 1 capsule (500 mg total) by mouth every 12 (twelve) hours.   lisinopril 10 MG tablet Commonly known as: ZESTRIL Take 20 mg by mouth daily.   metoprolol tartrate 50 MG tablet Commonly known as: LOPRESSOR Take 50 mg by mouth 2 (two) times daily.   omeprazole 40 MG capsule Commonly known as: PRILOSEC Take 40 mg by mouth daily.   QUEtiapine 50 MG tablet Commonly known as: SEROQUEL Take 1 tablet (50 mg total) by mouth at bedtime. What changed:   medication strength  when to take this   QUEtiapine 25 MG tablet Commonly known as: SEROQUEL Take 1 tablet (25 mg total) by mouth daily. What changed: You were already taking a medication with the same name, and this prescription was added. Make sure you understand how and when to take each.   Zoloft 25 MG tablet Generic drug: sertraline Take 25 mg by mouth daily.   sertraline 50 MG tablet Commonly known as: ZOLOFT Take 50 mg by mouth daily.   Synthroid 88 MCG tablet Generic drug: levothyroxine Take 88 mcg by mouth daily.   traZODone 50 MG tablet Commonly known as: DESYREL Take 0.5 tablets (25  mg total) by mouth at bedtime as needed for sleep.       Allergies  Allergen Reactions  . Atorvastatin Calcium     Forgetful, foggy, leg cramps, off balance  . Sulfa Antibiotics Nausea Only    Consultations:  none   Procedures/Studies: DG Chest 2 View  Result Date: 06/12/2019 CLINICAL DATA:  Altered mental status, fall yesterday EXAM: CHEST - 2 VIEW COMPARISON:  Radiograph 02/25/2019 FINDINGS: Low  lung volumes with streaky basilar atelectatic changes. No focal consolidation or convincing features of edema. No pneumothorax or effusion. Mild prominence of the cardiac silhouette is likely related to portable technique on frontal radiograph. Cardiomediastinal contours are otherwise unremarkable. No acute osseous or soft tissue abnormality or injury is seen. IMPRESSION: Low volumes and atelectasis. No other acute cardiopulmonary or traumatic findings in the chest. Electronically Signed   By: Kreg ShropshirePrice  DeHay M.D.   On: 06/12/2019 22:06   CT Head Wo Contrast  Result Date: 06/12/2019 CLINICAL DATA:  Increasing confusion, fall yesterday EXAM: CT HEAD WITHOUT CONTRAST TECHNIQUE: Contiguous axial images were obtained from the base of the skull through the vertex without intravenous contrast. COMPARISON:  CT head 02/25/2019 FINDINGS: Brain: No evidence of acute infarction, hemorrhage, hydrocephalus, extra-axial collection or mass lesion/mass effect. Symmetric prominence of the ventricles, cisterns and sulci compatible with parenchymal volume loss. Patchy areas of white matter hypoattenuation are most compatible with chronic microvascular angiopathy. Vascular: Atherosclerotic calcification of the carotid siphons and intradural vertebral arteries. No hyperdense vessel. Skull: No calvarial fracture or suspicious osseous lesion. No scalp swelling or hematoma. Sinuses/Orbits: Paranasal sinuses and mastoid air cells are predominantly clear. Included orbital structures are unremarkable. Other: Debris in the external auditory canals. IMPRESSION: 1. No acute intracranial findings. 2. Senescent changes with parenchymal volume loss and chronic microvascular angiopathy. 3. Debris in the external auditory canals, correlate for cerumen impaction. Electronically Signed   By: Kreg ShropshirePrice  DeHay M.D.   On: 06/12/2019 22:12     Subjective: Alert, oriented only to person. She was able to tell me her son name Daren. She denies pain.  She  has been off restrain for 24 hours.   Discharge Exam: Vitals:   06/16/19 1333 06/17/19 0531  BP: 133/67 133/72  Pulse: (!) 57 70  Resp: 16 16  Temp: (!) 97.5 F (36.4 C) (!) 97.5 F (36.4 C)  SpO2:  96%     General: Pt is alert, awake, not in acute distress Cardiovascular: RRR, S1/S2 +, no rubs, no gallops Respiratory: CTA bilaterally, no wheezing, no rhonchi Abdominal: Soft, NT, ND, bowel sounds + Extremities: no edema, no cyanosis    The results of significant diagnostics from this hospitalization (including imaging, microbiology, ancillary and laboratory) are listed below for reference.     Microbiology: Recent Results (from the past 240 hour(s))  Urine culture     Status: Abnormal   Collection Time: 06/12/19  9:29 PM   Specimen: Urine, Random  Result Value Ref Range Status   Specimen Description   Final    URINE, RANDOM Performed at [facility], [address] [place], [place] [address]    Special Requests   Final    NONE Performed at [facility], [address] [place], [place] [address]    Culture >=100,000 COLONIES/mL ESCHERICHIA COLI (A)  Final   Report Status 06/15/2019 FINAL  Final   Organism ID, Bacteria ESCHERICHIA COLI (A)  Final      Susceptibility   Escherichia coli - MIC*  AMPICILLIN <=2 SENSITIVE Sensitive     CEFAZOLIN <=4 SENSITIVE Sensitive     CEFTRIAXONE <=0.25 SENSITIVE Sensitive     CIPROFLOXACIN >=4 RESISTANT Resistant     GENTAMICIN <=1 SENSITIVE Sensitive     IMIPENEM <=0.25 SENSITIVE Sensitive     NITROFURANTOIN <=16 SENSITIVE Sensitive     TRIMETH/SULFA <=20 SENSITIVE Sensitive     AMPICILLIN/SULBACTAM <=2 SENSITIVE Sensitive     PIP/TAZO <=4 SENSITIVE Sensitive     * >=100,000 COLONIES/mL ESCHERICHIA COLI  Blood culture (routine x 2)     Status: None (Preliminary result)   Collection Time: 06/12/19  9:30 PM   Specimen: BLOOD  Result Value Ref Range Status   Specimen Description    Final    BLOOD RIGHT ANTECUBITAL Performed at [facility], [address], [place], [place] [address]    Special Requests   Final    BOTTLES DRAWN AEROBIC AND ANAEROBIC Blood Culture results may not be optimal due to an inadequate volume of blood received in culture bottles Performed at [facility], [address] [place], [place] [address]    Culture   Final    NO GROWTH 4 DAYS Performed at [facility], [address] [place], [place] [address]    Report Status PENDING  Incomplete  Blood culture (routine x 2)     Status: None (Preliminary result)   Collection Time: 06/12/19  9:40 PM   Specimen: BLOOD RIGHT HAND  Result Value Ref Range Status   Specimen Description   Final    BLOOD RIGHT HAND Performed at [facility], [address] [place], [place] [address]    Special Requests   Final    BOTTLES DRAWN AEROBIC ONLY Blood Culture results may not be optimal due to an inadequate volume of blood received in culture bottles Performed at [facility], [address], [place], [place] [address]    Culture   Final    NO GROWTH 4 DAYS Performed at [facility], [address], [place], [place] [address]    Report Status PENDING  Incomplete  SARS CORONAVIRUS 2 (TAT 6-24 HRS) Nasopharyngeal Nasopharyngeal Swab     Status: None   Collection Time: 06/12/19 11:12 PM   Specimen: Nasopharyngeal Swab  Result Value Ref Range Status   SARS Coronavirus 2 NEGATIVE NEGATIVE Final    Comment: (NOTE) SARS-CoV-2 target nucleic acids are NOT DETECTED. The SARS-CoV-2 RNA is generally detectable in upper and lower respiratory specimens during the acute phase of infection. Negative results do not preclude SARS-CoV-2 infection, do not rule out co-infections with other pathogens, and should not be used as the sole basis for treatment or other patient management decisions. Negative results must be combined with  clinical observations, patient history, and epidemiological information. The expected result is Negative. Fact Sheet for Patients: HairSlick.no Fact Sheet for Healthcare Providers: quierodirigir.com This test is not yet approved or cleared by the [place] FDA and  has been authorized for detection and/or diagnosis of SARS-CoV-2 by FDA under an Emergency Use Authorization (EUA). This EUA will remain  in effect (meaning this test can be used) for the duration of the COVID-19 declaration under Section 56 4(b)(1) of the Act, 21 U.S.C. section 360bbb-3(b)(1), unless the authorization is terminated or revoked sooner. Performed at [facility], [address] [place], [place] [address]   SARS CORONAVIRUS 2 (TAT 6-24 HRS) Nasopharyngeal Nasopharyngeal Swab     Status:  None   Collection Time: 06/15/19 12:18 PM   Specimen: Nasopharyngeal Swab  Result Value Ref Range Status   SARS Coronavirus 2 NEGATIVE NEGATIVE Final    Comment: (NOTE) SARS-CoV-2 target nucleic acids are NOT DETECTED. The SARS-CoV-2 RNA is generally detectable in upper and lower respiratory specimens during the acute phase of infection. Negative results do not preclude SARS-CoV-2 infection, do not rule out co-infections with other pathogens, and should not be used as the sole basis for treatment or other patient management decisions. Negative results must be combined with clinical observations, patient history, and epidemiological information. The expected result is Negative. Fact Sheet for Patients: HairSlick.no Fact Sheet for Healthcare Providers: quierodirigir.com This test is not yet approved or cleared by the [place] FDA and  has been authorized for detection and/or diagnosis of SARS-CoV-2 by FDA under an Emergency Use Authorization (EUA). This EUA will remain  in effect (meaning this test can  be used) for the duration of the COVID-19 declaration under Section 56 4(b)(1) of the Act, 21 U.S.C. section 360bbb-3(b)(1), unless the authorization is terminated or revoked sooner. Performed at [facility], [address], [place], [place] [address]      Labs: BNP (last 3 results) No results for input(s): BNP in the last 8760 hours. Basic Metabolic Panel: Recent Labs  Lab 06/12/19 2129 06/13/19 0652 06/15/19 0528 06/17/19 0524  NA 138 140 139 140  K 4.3 4.0 3.6 3.9  CL 103 108 109 108  CO2 27 24 23 23   GLUCOSE 100* 91 89 89  BUN 20 17 13 17   CREATININE 0.73 0.68 0.67 0.60  CALCIUM 9.3 8.7* 8.3* 8.6*   Liver Function Tests: Recent Labs  Lab 06/12/19 2129 06/13/19 0652  AST 20 21  ALT 24 20  ALKPHOS 86 74  BILITOT 0.7 0.6  PROT 7.1 6.2*  ALBUMIN 4.0 3.4*   No results for input(s): LIPASE, AMYLASE in the last 168 hours. No results for input(s): AMMONIA in the last 168 hours. CBC: Recent Labs  Lab 06/12/19 2300 06/13/19 0652 06/15/19 0528 06/17/19 0524  WBC 5.2 7.7 5.3 6.3  NEUTROABS 2.9  --   --   --   HGB 12.5 13.0 11.7* 12.3  HCT 38.7 41.7 36.5 38.5  MCV 94.9 95.0 95.1 93.4  PLT 201 194 193 247   Cardiac Enzymes: No results for input(s): CKTOTAL, CKMB, CKMBINDEX, TROPONINI in the last 168 hours. BNP: Invalid input(s): POCBNP CBG: No results for input(s): GLUCAP in the last 168 hours. D-Dimer No results for input(s): DDIMER in the last 72 hours. Hgb A1c No results for input(s): HGBA1C in the last 72 hours. Lipid Profile No results for input(s): CHOL, HDL, LDLCALC, TRIG, CHOLHDL, LDLDIRECT in the last 72 hours. Thyroid function studies No results for input(s): TSH, T4TOTAL, T3FREE, THYROIDAB in the last 72 hours.  Invalid input(s): FREET3 Anemia work up No results for input(s): VITAMINB12, FOLATE, FERRITIN, TIBC, IRON, RETICCTPCT in the last 72 hours. Urinalysis    Component Value Date/Time   COLORURINE YELLOW 06/12/2019 2129    APPEARANCEUR HAZY (A) 06/12/2019 2129   LABSPEC 1.009 06/12/2019 2129   PHURINE 6.0 06/12/2019 2129   GLUCOSEU NEGATIVE 06/12/2019 2129   HGBUR NEGATIVE 06/12/2019 2129   BILIRUBINUR NEGATIVE 06/12/2019 2129   KETONESUR NEGATIVE 06/12/2019 2129   PROTEINUR NEGATIVE 06/12/2019 2129   NITRITE POSITIVE (A) 06/12/2019 2129   LEUKOCYTESUR MODERATE (A) 06/12/2019 2129   Sepsis Labs Invalid input(s): PROCALCITONIN,  WBC,  LACTICIDVEN Microbiology Recent  Results (from the past 240 hour(s))  Urine culture     Status: Abnormal   Collection Time: 06/12/19  9:29 PM   Specimen: Urine, Random  Result Value Ref Range Status   Specimen Description   Final    URINE, RANDOM Performed at [facility], [address] [place], [place] [address]    Special Requests   Final    NONE Performed at [facility], [address] [place], [place] [address]    Culture >=100,000 COLONIES/mL ESCHERICHIA COLI (A)  Final   Report Status 06/15/2019 FINAL  Final   Organism ID, Bacteria ESCHERICHIA COLI (A)  Final      Susceptibility   Escherichia coli - MIC*    AMPICILLIN <=2 SENSITIVE Sensitive     CEFAZOLIN <=4 SENSITIVE Sensitive     CEFTRIAXONE <=0.25 SENSITIVE Sensitive     CIPROFLOXACIN >=4 RESISTANT Resistant     GENTAMICIN <=1 SENSITIVE Sensitive     IMIPENEM <=0.25 SENSITIVE Sensitive     NITROFURANTOIN <=16 SENSITIVE Sensitive     TRIMETH/SULFA <=20 SENSITIVE Sensitive     AMPICILLIN/SULBACTAM <=2 SENSITIVE Sensitive     PIP/TAZO <=4 SENSITIVE Sensitive     * >=100,000 COLONIES/mL ESCHERICHIA COLI  Blood culture (routine x 2)     Status: None (Preliminary result)   Collection Time: 06/12/19  9:30 PM   Specimen: BLOOD  Result Value Ref Range Status   Specimen Description   Final    BLOOD RIGHT ANTECUBITAL Performed at [facility], [address], [place], [place] [address]    Special Requests   Final    BOTTLES DRAWN AEROBIC AND  ANAEROBIC Blood Culture results may not be optimal due to an inadequate volume of blood received in culture bottles Performed at [facility], [address], [place], [place] [address]    Culture   Final    NO GROWTH 4 DAYS Performed at [facility], [address], [place], [place] [address]    Report Status PENDING  Incomplete  Blood culture (routine x 2)     Status: None (Preliminary result)   Collection Time: 06/12/19  9:40 PM   Specimen: BLOOD RIGHT HAND  Result Value Ref Range Status   Specimen Description   Final    BLOOD RIGHT HAND Performed at [facility], [address], [place], [place] [address]    Special Requests   Final    BOTTLES DRAWN AEROBIC ONLY Blood Culture results may not be optimal due to an inadequate volume of blood received in culture bottles Performed at [facility], [address] [place], [place] [address]    Culture   Final    NO GROWTH 4 DAYS Performed at [facility], [address] [place], [place] [address]    Report Status PENDING  Incomplete  SARS CORONAVIRUS 2 (TAT 6-24 HRS) Nasopharyngeal Nasopharyngeal Swab     Status: None   Collection Time: 06/12/19 11:12 PM   Specimen: Nasopharyngeal Swab  Result Value Ref Range Status   SARS Coronavirus 2 NEGATIVE NEGATIVE Final    Comment: (NOTE) SARS-CoV-2 target nucleic acids are NOT DETECTED. The SARS-CoV-2 RNA is generally detectable in upper and lower respiratory specimens during the acute phase of infection. Negative results do not preclude SARS-CoV-2 infection, do not rule out co-infections with other pathogens, and should not be used as the sole basis for treatment or other patient management decisions. Negative results  must be combined with clinical observations, patient history, and epidemiological information. The expected result is Negative. Fact Sheet for Patients: SugarRoll.be Fact  Sheet for Healthcare Providers: https://www.woods-mathews.com/ This test is not yet approved or cleared by the [place] FDA and  has been authorized for detection and/or diagnosis of SARS-CoV-2 by FDA under an Emergency Use Authorization (EUA). This EUA will remain  in effect (meaning this test can be used) for the duration of the COVID-19 declaration under Section 56 4(b)(1) of the Act, 21 U.S.C. section 360bbb-3(b)(1), unless the authorization is terminated or revoked sooner. Performed at [facility], [place] [address] [place], [place] [address]   SARS CORONAVIRUS 2 (TAT 6-24 HRS) Nasopharyngeal Nasopharyngeal Swab     Status: None   Collection Time: 06/15/19 12:18 PM   Specimen: Nasopharyngeal Swab  Result Value Ref Range Status   SARS Coronavirus 2 NEGATIVE NEGATIVE Final    Comment: (NOTE) SARS-CoV-2 target nucleic acids are NOT DETECTED. The SARS-CoV-2 RNA is generally detectable in upper and lower respiratory specimens during the acute phase of infection. Negative results do not preclude SARS-CoV-2 infection, do not rule out co-infections with other pathogens, and should not be used as the sole basis for treatment or other patient management decisions. Negative results must be combined with clinical observations, patient history, and epidemiological information. The expected result is Negative. Fact Sheet for Patients: SugarRoll.be Fact Sheet for Healthcare Providers: https://www.woods-mathews.com/ This test is not yet approved or cleared by the [place] FDA and  has been authorized for detection and/or diagnosis of SARS-CoV-2 by FDA under an Emergency Use Authorization (EUA). This EUA will remain  in effect (meaning this test can be used) for the duration of the COVID-19 declaration under Section 56 4(b)(1) of the Act, 21 U.S.C. section 360bbb-3(b)(1), unless the authorization is terminated or revoked  sooner. Performed at [facility], [place] [address], [place], [place] [address]      Time coordinating discharge: 40 minutes  SIGNED:   Elmarie Shiley, MD  [facility]

## 2019-06-17 NOTE — TOC Transition Note (Signed)
Transition of Care [facility]) - CM/SW Discharge Note   Patient Details  Name: Alison King MRN: [id number] Date of Birth: 1941/01/06  Transition of Care [facility]) CM/SW Contact:  Chamaine Stankus, Meriam Sprague, RN Phone Number: 06/17/2019, 10:37 AM   Clinical Narrative:    [facility] contacted for transport. Yellow DNR form on chart form transport. Pt to go to room 36 and number for RN to call report is [phone].       Patient Goals and CMS Choice     Choice offered to / list presented to : Adult Children  Discharge Placement                       Discharge Plan and Services   Discharge Planning Services: CM Consult              Social Determinants of Health (SDOH) Interventions     Readmission Risk Interventions No flowsheet data found.

## 2019-06-17 NOTE — Progress Notes (Signed)
Report has been called to Ander Slade, Charity fundraiser at [employer]

## 2019-06-17 NOTE — Progress Notes (Signed)
Daughter in law Selena Batten informed that [facility] was picking up patient at this time.

## 2019-06-18 LAB — CULTURE, BLOOD (ROUTINE X 2)
Culture: NO GROWTH
Culture: NO GROWTH

## 2019-07-03 ENCOUNTER — Other Ambulatory Visit: Payer: Self-pay

## 2019-07-03 ENCOUNTER — Non-Acute Institutional Stay: Payer: Medicare Other | Admitting: Internal Medicine

## 2019-09-08 DEATH — deceased

## 2021-05-24 IMAGING — CT CT CERVICAL SPINE W/O CM
3 of 4 series · 11 of 33 positions shown, 13 images · non-contrast
Comparison: None.

CLINICAL DATA: Posttraumatic headache after fall.

EXAM:
CT HEAD WITHOUT CONTRAST
CT CERVICAL SPINE WITHOUT CONTRAST
TECHNIQUE: Multidetector CT imaging of the head and cervical spine was
performed following the standard protocol without intravenous
contrast. Multiplanar CT image reconstructions of the cervical spine
were also generated.

[Series 7: sag bone · sagittal · 0.26mm/px · 5 of 75 slices shown, 6 images]
[im 25/75  bone]
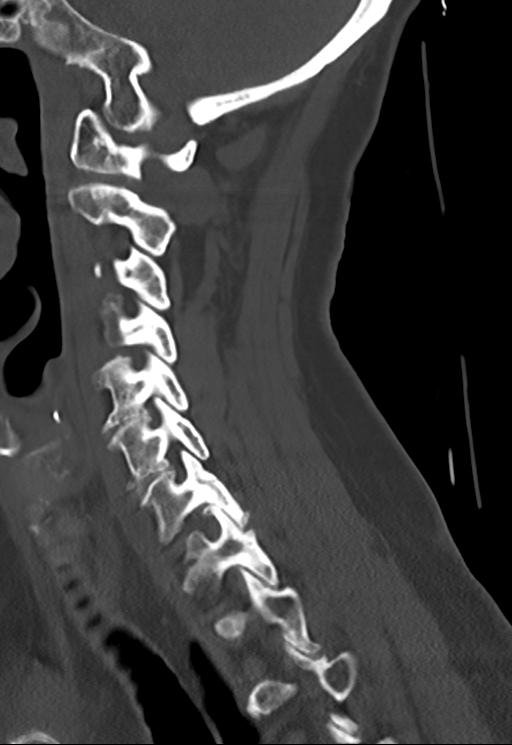
[im 31/75  bone]
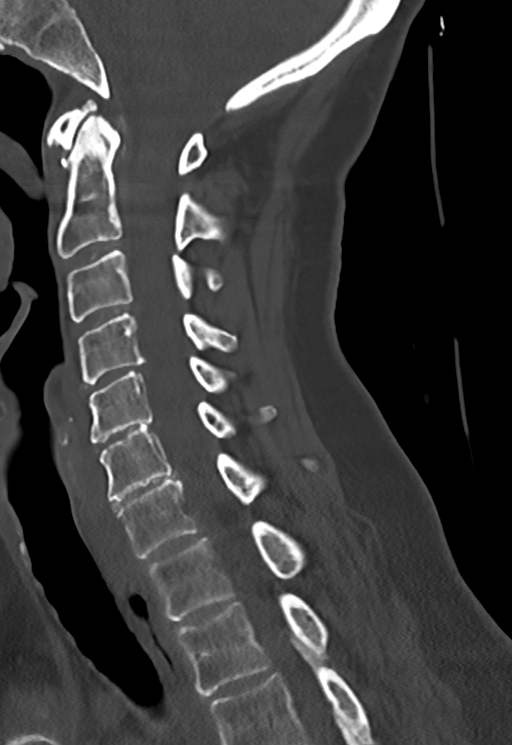
[im 38/75  soft-tissue]
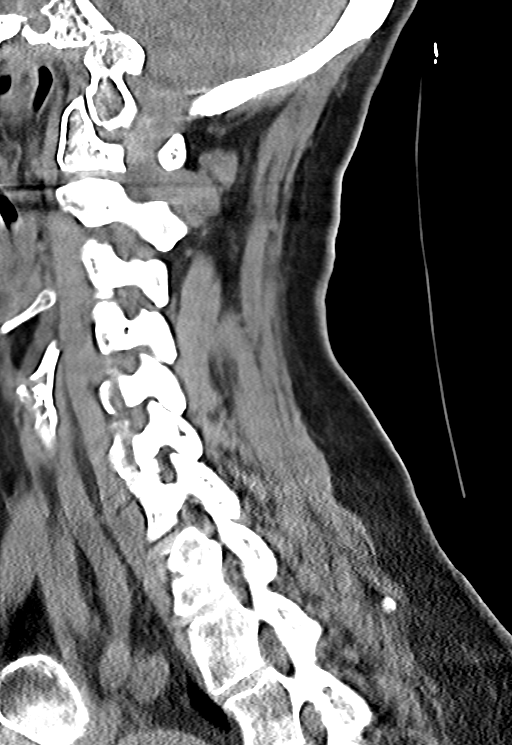
[im 38/75  bone]
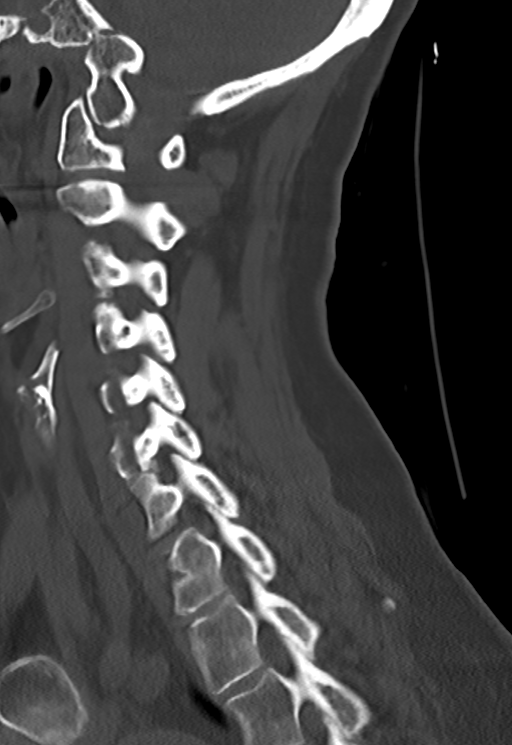
[im 44/75  bone]
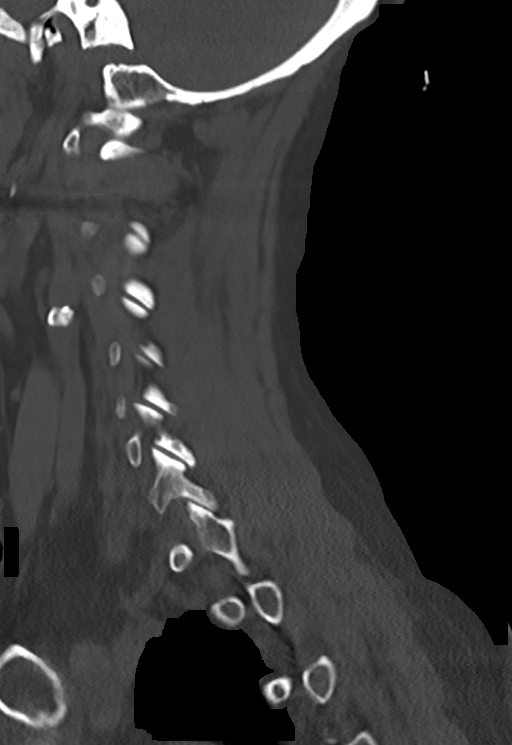
[im 50/75  bone]
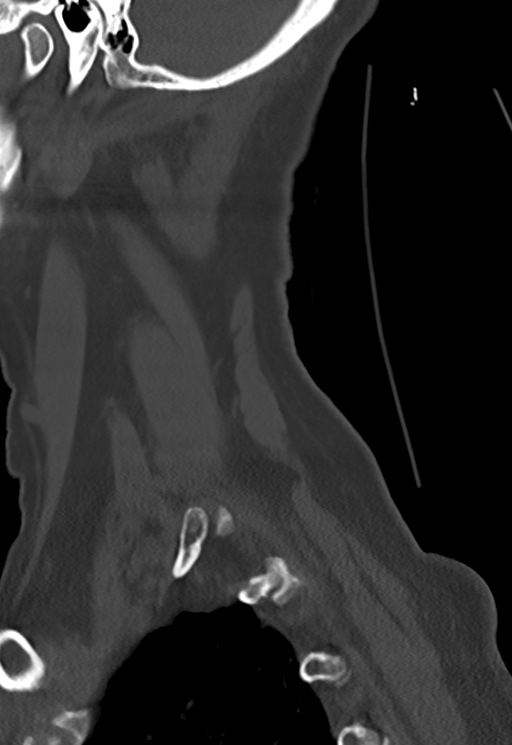

[Series 8: cor bone · coronal · 0.39mm/px · 3 of 61 slices shown]
[im 13/61  bone]
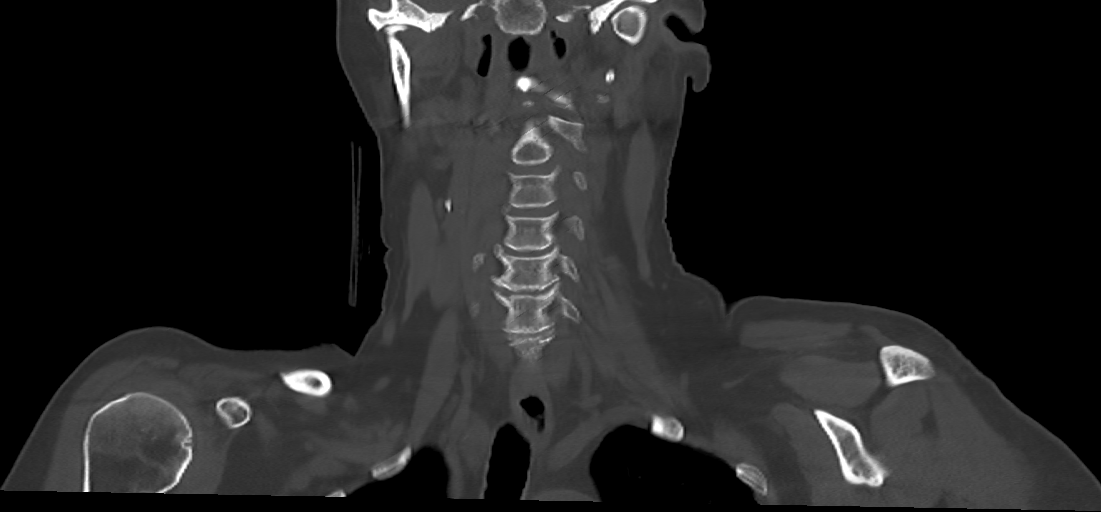
[im 25/61  bone]
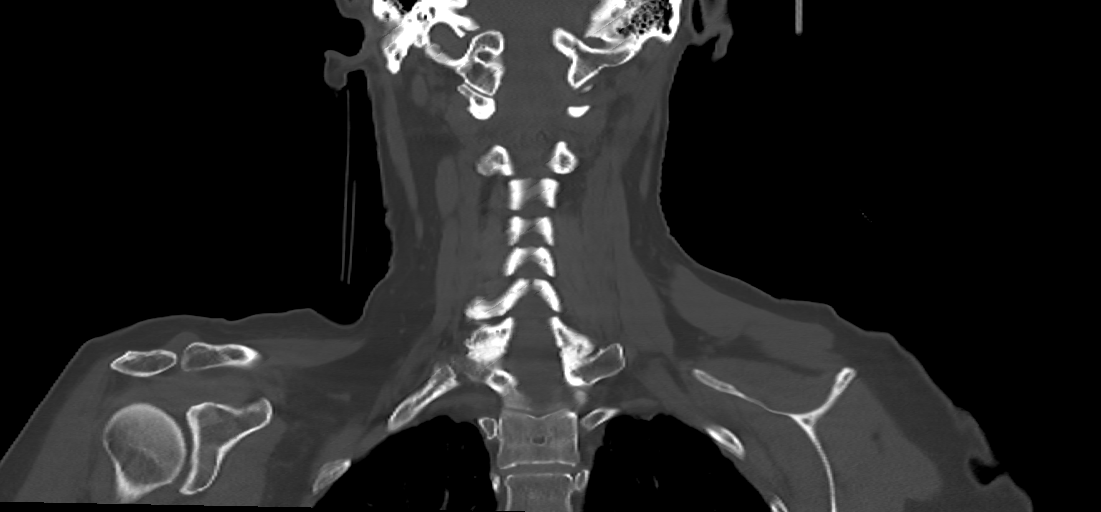
[im 37/61  bone]
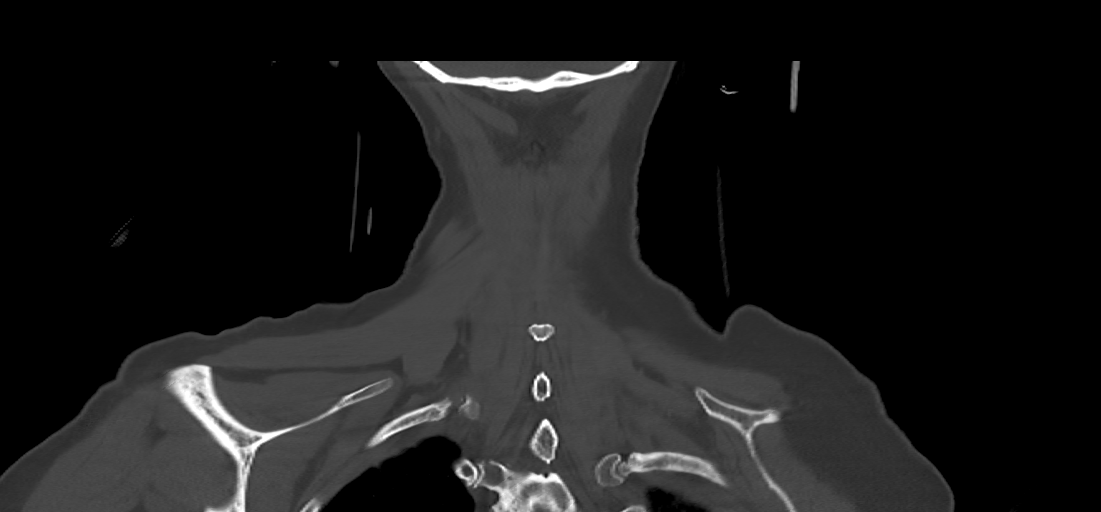

[Series 10: orthogonal axials · axial · 0.21mm/px · z∈[-248,-125]mm · 3 of 98 slices shown, 4 images]
[im 17/98  soft-tissue]
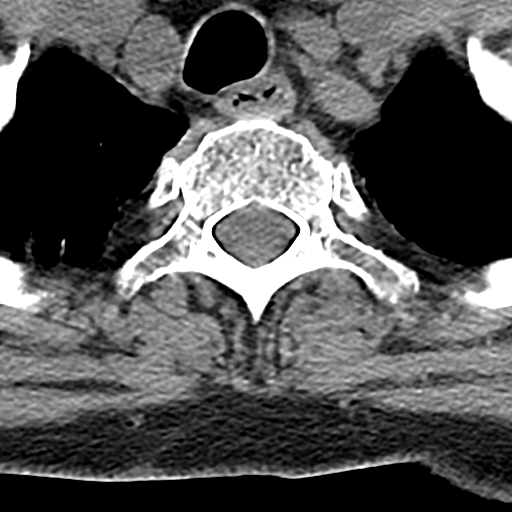
[im 17/98  bone]
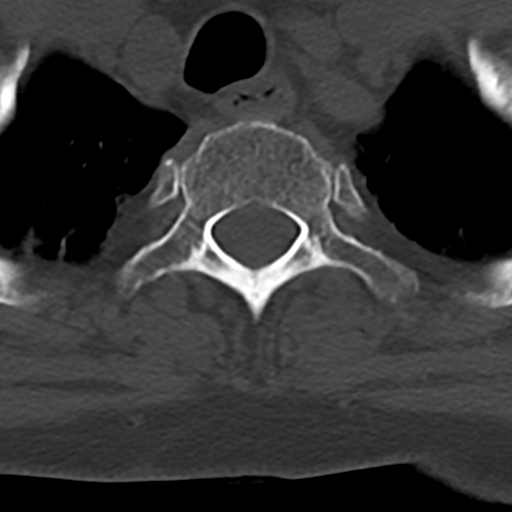
[im 49/98  bone]
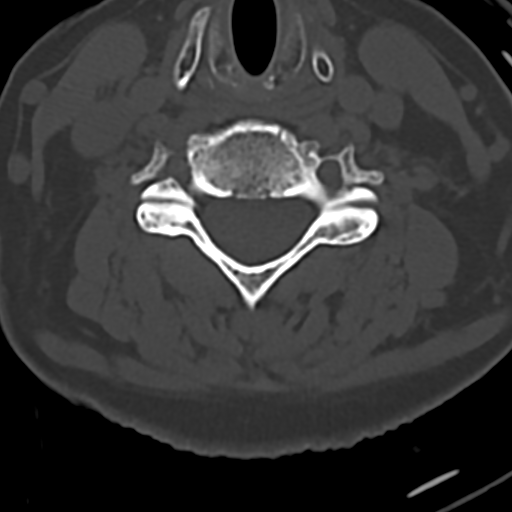
[im 81/98  bone]
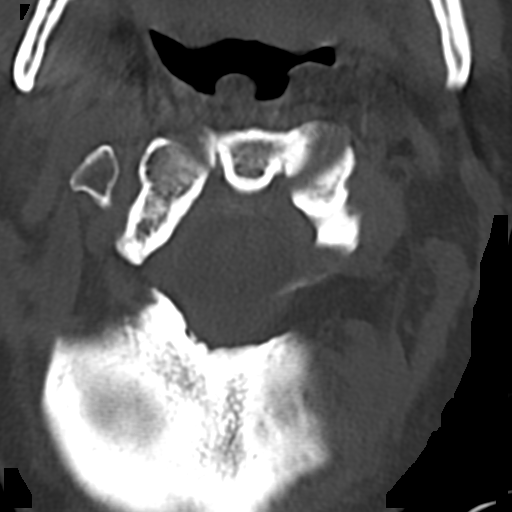

[11 of 33 positions shown; findings below may reference images not displayed]

FINDINGS: CT HEAD FINDINGS

Brain: Mild chronic ischemic white matter disease is noted. No mass
effect or midline shift is noted. Ventricular size is within normal
limits. There is no evidence of mass lesion, hemorrhage or acute
infarction.

Vascular: No hyperdense vessel or unexpected calcification.

Skull: Normal. Negative for fracture or focal lesion.

Sinuses/Orbits: No acute finding.

Other: Small left frontal scalp hematoma is noted.

CT CERVICAL SPINE FINDINGS

Alignment: Minimal grade 1 retrolisthesis of C4-5 and C5-6 is noted
secondary to degenerative disc disease at these levels.

Skull base and vertebrae: No acute fracture. No primary bone lesion
or focal pathologic process.

Soft tissues and spinal canal: No prevertebral fluid or swelling. No
visible canal hematoma.

Disc levels: Moderate degenerative disc disease is noted at C4-5,
C5-6 and C6-7.

Upper chest: Negative.

Other: None.
IMPRESSION: Small left frontal scalp hematoma. Mild chronic ischemic white
matter disease. No acute intracranial abnormality seen.

Moderate multilevel degenerative disc disease. No acute abnormality
seen in the cervical spine.
